# Patient Record
Sex: Female | Born: 1990 | Race: Black or African American | Hispanic: No | Marital: Single | Smoking: Current every day smoker
Health system: Southern US, Community
[De-identification: ages and names within clinical notes are randomized; demographics above are authoritative.]

## PROBLEM LIST (undated history)

## (undated) ENCOUNTER — Inpatient Hospital Stay (HOSPITAL_COMMUNITY): Payer: Self-pay

## (undated) DIAGNOSIS — K5090 Crohn's disease, unspecified, without complications: Secondary | ICD-10-CM

## (undated) DIAGNOSIS — O139 Gestational [pregnancy-induced] hypertension without significant proteinuria, unspecified trimester: Secondary | ICD-10-CM

## (undated) DIAGNOSIS — E119 Type 2 diabetes mellitus without complications: Secondary | ICD-10-CM

## (undated) DIAGNOSIS — J45909 Unspecified asthma, uncomplicated: Secondary | ICD-10-CM

## (undated) DIAGNOSIS — K219 Gastro-esophageal reflux disease without esophagitis: Secondary | ICD-10-CM

## (undated) HISTORY — PX: NO PAST SURGERIES: SHX2092

---

## 2015-01-05 ENCOUNTER — Emergency Department (HOSPITAL_COMMUNITY)
Admission: EM | Admit: 2015-01-05 | Discharge: 2015-01-05 | Disposition: A | Discharge status: Home / Self Care | Payer: Medicaid Other | Attending: Emergency Medicine | Admitting: Emergency Medicine

## 2015-01-05 ENCOUNTER — Encounter (HOSPITAL_COMMUNITY): Payer: Self-pay | Admitting: Emergency Medicine

## 2015-01-05 DIAGNOSIS — E11649 Type 2 diabetes mellitus with hypoglycemia without coma: Secondary | ICD-10-CM | POA: Diagnosis not present

## 2015-01-05 DIAGNOSIS — Z3202 Encounter for pregnancy test, result negative: Secondary | ICD-10-CM | POA: Insufficient documentation

## 2015-01-05 DIAGNOSIS — R55 Syncope and collapse: Secondary | ICD-10-CM | POA: Insufficient documentation

## 2015-01-05 DIAGNOSIS — A5901 Trichomonal vulvovaginitis: Secondary | ICD-10-CM | POA: Insufficient documentation

## 2015-01-05 DIAGNOSIS — Z8669 Personal history of other diseases of the nervous system and sense organs: Secondary | ICD-10-CM

## 2015-01-05 DIAGNOSIS — R42 Dizziness and giddiness: Secondary | ICD-10-CM | POA: Diagnosis present

## 2015-01-05 DIAGNOSIS — Z880 Allergy status to penicillin: Secondary | ICD-10-CM | POA: Insufficient documentation

## 2015-01-05 HISTORY — DX: Type 2 diabetes mellitus without complications: E11.9

## 2015-01-05 LAB — I-STAT TROPONIN, ED: Troponin i, poc: 0 ng/mL (ref 0.00–0.08)

## 2015-01-05 LAB — CBC
HCT: 43.2 % (ref 36.0–46.0)
Hemoglobin: 14 g/dL (ref 12.0–15.0)
MCH: 26.7 pg (ref 26.0–34.0)
MCHC: 32.4 g/dL (ref 30.0–36.0)
MCV: 82.4 fL (ref 78.0–100.0)
PLATELETS: 268 10*3/uL (ref 150–400)
RBC: 5.24 MIL/uL — ABNORMAL HIGH (ref 3.87–5.11)
RDW: 14.9 % (ref 11.5–15.5)
WBC: 10.2 10*3/uL (ref 4.0–10.5)

## 2015-01-05 LAB — BASIC METABOLIC PANEL
Anion gap: 8 (ref 5–15)
BUN: 10 mg/dL (ref 6–20)
CALCIUM: 9 mg/dL (ref 8.9–10.3)
CHLORIDE: 108 mmol/L (ref 101–111)
CO2: 24 mmol/L (ref 22–32)
CREATININE: 0.92 mg/dL (ref 0.44–1.00)
GFR calc Af Amer: >60 mL/min (ref >60)
GFR calc non Af Amer: >60 mL/min (ref >60)
Glucose, Bld: 78 mg/dL (ref 65–99)
Potassium: 3.7 mmol/L (ref 3.5–5.1)
Sodium: 140 mmol/L (ref 135–145)

## 2015-01-05 LAB — PREGNANCY, URINE: Preg Test, Ur: NEGATIVE

## 2015-01-05 LAB — URINE MICROSCOPIC-ADD ON

## 2015-01-05 LAB — D-DIMER, QUANTITATIVE: D-Dimer, Quant: 0.31 ug/mL-FEU (ref 0.00–0.48)

## 2015-01-05 LAB — URINALYSIS, ROUTINE W REFLEX MICROSCOPIC
BILIRUBIN URINE: NEGATIVE
GLUCOSE, UA: NEGATIVE mg/dL
KETONES UR: NEGATIVE mg/dL
Nitrite: NEGATIVE
PH: 6 (ref 5.0–8.0)
Protein, ur: NEGATIVE mg/dL
Specific Gravity, Urine: 1.023 (ref 1.005–1.030)
Urobilinogen, UA: 0.2 mg/dL (ref 0.0–1.0)

## 2015-01-05 LAB — CBG MONITORING, ED: Glucose-Capillary: 123 mg/dL — ABNORMAL HIGH (ref 65–99)

## 2015-01-05 MED ORDER — ACETAMINOPHEN 325 MG PO TABS
650.0000 mg | ORAL_TABLET | Freq: Once | ORAL | Status: AC
  Administered 2015-01-05: 650 mg via ORAL
  Filled 2015-01-05: qty 2

## 2015-01-05 MED ORDER — IBUPROFEN 800 MG PO TABS
800.0000 mg | ORAL_TABLET | Freq: Once | ORAL | Status: DC
  Filled 2015-01-05: qty 1

## 2015-01-05 MED ORDER — METRONIDAZOLE 500 MG PO TABS
2000.0000 mg | ORAL_TABLET | Freq: Once | ORAL | Status: AC
Start: 2015-01-05 — End: 2015-01-05
  Administered 2015-01-05: 2000 mg via ORAL
  Filled 2015-01-05: qty 4

## 2015-01-05 MED ORDER — ONDANSETRON 4 MG PO TBDP
4.0000 mg | ORAL_TABLET | Freq: Once | ORAL | Status: AC
  Administered 2015-01-05: 4 mg via ORAL
  Filled 2015-01-05: qty 1

## 2015-01-05 MED ORDER — SODIUM CHLORIDE 0.9 % IV BOLUS (SEPSIS)
1000.0000 mL | Freq: Once | INTRAVENOUS | Status: AC
  Administered 2015-01-05: 1000 mL via INTRAVENOUS

## 2015-01-05 MED ORDER — TETANUS-DIPHTH-ACELL PERTUSSIS 5-2.5-18.5 LF-MCG/0.5 IM SUSP
0.5000 mL | Freq: Once | INTRAMUSCULAR | Status: AC
  Administered 2015-01-05: 0.5 mL via INTRAMUSCULAR
  Filled 2015-01-05: qty 0.5

## 2015-01-05 NOTE — ED Notes (Addendum)
EMS called out because she felt weak and couldn't see. When EMS got there she was eating food, CBG was 100. EMS states one minute acts like she can see, then the next states she's having blurry vision. States this happens frequently when her blood sugar gets low. Also got stung by a bee to the back of right arm, EMS states only localized reaction.  Pt states her vision is normally blurry, but gets worse when her glucose gets low. Pt states she can only see colors, pt texting when I walked into room holding the phone close to her face. States she's feeling better since eating, but that her vision is still very blurry. Also complaining of bee sting to right lower abdomin, small amount of localized swelling to area.

## 2015-01-05 NOTE — ED Notes (Signed)
Pt refused to allow PA Joni Reining to do pelvic exam, she agreed to have her family doctor do it.

## 2015-01-05 NOTE — Discharge Instructions (Signed)
Please follow with your primary care doctor in the next 2 days for a check-up. They must obtain records for further management.   Do not hesitate to return to the Emergency Department for any new, worsening or concerning symptoms.   You were not tested for all STDs today. Refrain from sex until you have the results from a full STD screen. Please go to [facility] (Address: [address], [place], [place] [address] Phone: [phone]) or see the [facility] (Address: [address] Phone: [phone]) for full STD screening. Return to the emergency room for worsening of symptoms, fever, and vomiting.    Trichomoniasis Trichomoniasis is an infection caused by an organism called Trichomonas. The infection can affect both women and men. In women, the outer female genitalia and the vagina are affected. In men, the penis is mainly affected, but the prostate and other reproductive organs can also be involved. Trichomoniasis is a sexually transmitted infection (STI) and is most often passed to another person through sexual contact.  RISK FACTORS  Having unprotected sexual intercourse.  Having sexual intercourse with an infected partner. SIGNS AND SYMPTOMS  Symptoms of trichomoniasis in women include:  Abnormal gray-green frothy vaginal discharge.  Itching and irritation of the vagina.  Itching and irritation of the area outside the vagina. Symptoms of trichomoniasis in men include:   Penile discharge with or without pain.  Pain during urination. This results from inflammation of the urethra. DIAGNOSIS  Trichomoniasis may be found during a Pap test or physical exam. Your health care provider may use one of the following methods to help diagnose this infection:  Testing the pH of the vagina with a test tape.  Using a vaginal swab test that checks for the Trichomonas organism. A test is available that provides results within a few minutes.  Examining a urine  sample.  Testing vaginal secretions. Your health care provider may test you for other STIs, including HIV. TREATMENT   You may be given medicine to fight the infection. Women should inform their health care provider if they could be or are pregnant. Some medicines used to treat the infection should not be taken during pregnancy.  Your health care provider may recommend over-the-counter medicines or creams to decrease itching or irritation.  Your sexual partner will need to be treated if infected.  Your health care provider may test you for infection again 3 months after treatment. HOME CARE INSTRUCTIONS   Take medicines only as directed by your health care provider.  Take over-the-counter medicine for itching or irritation as directed by your health care provider.  Do not have sexual intercourse while you have the infection.  Women should not douche or wear tampons while they have the infection.  Discuss your infection with your partner. Your partner may have gotten the infection from you, or you may have gotten it from your partner.  Have your sex partner get examined and treated if necessary.  Practice safe, informed, and protected sex.  See your health care provider for other STI testing. SEEK MEDICAL CARE IF:   You still have symptoms after you finish your medicine.  You develop abdominal pain.  You have pain when you urinate.  You have bleeding after sexual intercourse.  You develop a rash.  Your medicine makes you sick or makes you throw up (vomit). MAKE SURE YOU:  Understand these instructions.  Will watch your condition.  Will get help right away if you are not doing well or get  worse.   This information is not intended to replace advice given to you by your health care provider. Make sure you discuss any questions you have with your health care provider.   Document Released: 09/06/2000 Document Revised: 04/03/2014 Document Reviewed: 12/23/2012 Elsevier  Interactive Patient Education [employer]

## 2015-01-05 NOTE — ED Provider Notes (Signed)
CSN: [id number]     Arrival date & time 01/05/15  1422 History   First MD Initiated Contact with Patient 01/05/15 1547     Chief Complaint  Patient presents with  . Blurred Vision  . Hypoglycemia     (Consider location/radiation/quality/duration/timing/severity/associated sxs/prior Treatment) HPI  Blood pressure 141/81, pulse 101, temperature 98.3 F (36.8 C), temperature source Oral, resp. rate 18, SpO2 100 %.  Olivia Jarvis is a 24 y.o. female complaining of chronic blurred vision which she states she's had for years, states she had glasses when she was 46 but never got them replaced and they were lost. Patient also reports feeling lightheaded consistent with prior episodes of hypoglycemia. States that she has a anterior chest pain that is not pleuritic or exacerbated with exertion, movement or palpation. States that this started yesterday. Patient is diabetic but not taking medications, was advised by her primary care to DC metformin because it made her feel lightheaded. She supposed to be checking her blood sugars at home but has not done that in the week because she is out of test strips. Patient denies nausea, vomiting, history of DVT or PE, she takes Depakote shots for birth control. No recent immobilizations. No family history of early cardiac death. States that her vision is unchanged from her baseline. States that by the time EMS got to her for lightheaded sensation she had been eating and her blood sugar was normalized. Patient states while she was waiting for EMS she was stung by a bee, last tetanus shot unknown. Denies shortness of breath, cough, dyspepsia, nausea, vomiting, HLD, FH of ACS. Pt endorses occasional tobacco use.   Past Medical History  Diagnosis Date  . Diabetes mellitus without complication (HCC)    History reviewed. No pertinent past surgical history. History reviewed. No pertinent family history. Social History  Substance Use Topics  . Smoking status: None   . Smokeless tobacco: None  . Alcohol Use: None   OB History    No data available     Review of Systems  10 systems reviewed and found to be negative, except as noted in the HPI.   Allergies  Penicillins  Home Medications   Prior to Admission medications   Medication Sig Start Date End Date Taking? Authorizing Provider  doxycycline (VIBRAMYCIN) 100 MG capsule take 1 capsule by mouth twice a day for 14 days 12/29/14   Historical Provider, MD   BP 115/71 mmHg  Pulse 86  Temp(Src) 98.3 F (36.8 C) (Oral)  Resp 18  SpO2 100% Physical Exam  Constitutional: She is oriented to person, place, and time. She appears well-developed and well-nourished. No distress.  HENT:  Head: Normocephalic.  Mouth/Throat: Oropharynx is clear and moist.  Eyes: Conjunctivae and EOM are normal. Pupils are equal, round, and reactive to light.  Neck: Normal range of motion. No JVD present. No tracheal deviation present.  Cardiovascular: Normal rate, regular rhythm and intact distal pulses.   Radial pulse equal bilaterally  Pulmonary/Chest: Effort normal and breath sounds normal. No stridor. No respiratory distress. She has no wheezes. She has no rales. She exhibits no tenderness.  Abdominal: Soft. She exhibits no distension and no mass. There is no tenderness. There is no rebound and no guarding.  Musculoskeletal: Normal range of motion. She exhibits no edema or tenderness.  No calf asymmetry, superficial collaterals, palpable cords, edema, Homans sign negative bilaterally.    Neurological: She is alert and oriented to person, place, and time.  Skin: Skin is warm.  She is not diaphoretic.  Psychiatric: She has a normal mood and affect.  Nursing note and vitals reviewed.   ED Course  Procedures (including critical care time) Labs Review Labs Reviewed  CBC - Abnormal; Notable for the following:    RBC 5.24 (*)    All other components within normal limits  URINALYSIS, ROUTINE W REFLEX MICROSCOPIC  (NOT AT [facility]) - Abnormal; Notable for the following:    APPearance CLOUDY (*)    Hgb urine dipstick TRACE (*)    Leukocytes, UA MODERATE (*)    All other components within normal limits  URINE MICROSCOPIC-ADD ON - Abnormal; Notable for the following:    Squamous Epithelial / LPF MANY (*)    All other components within normal limits  CBG MONITORING, ED - Abnormal; Notable for the following:    Glucose-Capillary 123 (*)    All other components within normal limits  WET PREP, GENITAL  URINE CULTURE  BASIC METABOLIC PANEL  PREGNANCY, URINE  D-DIMER, QUANTITATIVE (NOT AT [facility])  RPR  HIV ANTIBODY (ROUTINE TESTING)  CBG MONITORING, ED  I-STAT TROPOININ, ED  GC/CHLAMYDIA PROBE AMP ([place]) NOT AT [facility]    Imaging Review No results found. I have personally reviewed and evaluated these images and lab results as part of my medical decision-making.   EKG Interpretation None      MDM   Final diagnoses:  Trichomonal vaginitis  Pre-syncope  History of blurred vision    Filed Vitals:   01/05/15 1430 01/05/15 1728  BP: 141/81 115/71  Pulse: 101 86  Temp: 98.3 F (36.8 C)   TempSrc: Oral   Resp: 18 18  SpO2: 100% 100%    Medications  ibuprofen (ADVIL,MOTRIN) tablet 800 mg (800 mg Oral Not Given 01/05/15 1745)  metroNIDAZOLE (FLAGYL) tablet 2,000 mg (not administered)  ondansetron (ZOFRAN-ODT) disintegrating tablet 4 mg (not administered)  sodium chloride 0.9 % bolus 1,000 mL (0 mLs Intravenous Stopped 01/05/15 1833)  Tdap (BOOSTRIX) injection 0.5 mL (0.5 mLs Intramuscular Given 01/05/15 1639)  acetaminophen (TYLENOL) tablet 650 mg (650 mg Oral Given 01/05/15 1757)    Olivia Jarvis is a pleasant 24 y.o. female presenting with lightheaded sensation, has blurred vision which appears to be at her baseline. Patient has diabetes but is not on medications. States that she felt like her sugar was low enough which she attributed to her lightheadedness. Also complaining of  anterior chest pain. Patient takes Depo shot for birth control. Initially tachycardic, however PE not c/w DVT.   Dimer negative, Low risk by HEART score with normal EKG and normal troponin. Not orthostatic.   Urine is highly contaminated however however there is trichomonas present. Attempted to perform pelvic exam however patient could not cooperate with the exam, she states that she had a pelvic exam only 2 weeks ago. [facility] [place]. Patient will be treated for trichomonas with 2 g Flagyl. Advised her to refrain from sex or will set until she is a full STD screen.  Evaluation does not show pathology that would require ongoing emergent intervention or inpatient treatment. Pt is hemodynamically stable and mentating appropriately. Discussed findings and plan with patient/guardian, who agrees with care plan. All questions answered. Return precautions discussed and outpatient follow up given.      Wynetta Emery, PA-C 01/05/15 1909  Lavera Guise, MD 01/06/15 [phone]

## 2015-01-06 LAB — HIV ANTIBODY (ROUTINE TESTING W REFLEX): HIV SCREEN 4TH GENERATION: NONREACTIVE

## 2015-01-06 LAB — RPR: RPR: NONREACTIVE

## 2015-01-07 LAB — URINE CULTURE

## 2016-01-31 ENCOUNTER — Encounter (HOSPITAL_COMMUNITY): Payer: Self-pay | Admitting: *Deleted

## 2016-01-31 ENCOUNTER — Inpatient Hospital Stay (HOSPITAL_COMMUNITY)
Admission: AD | Admit: 2016-01-31 | Discharge: 2016-01-31 | Disposition: A | Discharge status: Home / Self Care | Payer: Medicaid Other | Source: Ambulatory Visit | Attending: Family Medicine | Admitting: Family Medicine

## 2016-01-31 DIAGNOSIS — O219 Vomiting of pregnancy, unspecified: Secondary | ICD-10-CM

## 2016-01-31 DIAGNOSIS — O99331 Smoking (tobacco) complicating pregnancy, first trimester: Secondary | ICD-10-CM | POA: Diagnosis not present

## 2016-01-31 DIAGNOSIS — R55 Syncope and collapse: Secondary | ICD-10-CM | POA: Diagnosis not present

## 2016-01-31 DIAGNOSIS — Z880 Allergy status to penicillin: Secondary | ICD-10-CM | POA: Insufficient documentation

## 2016-01-31 DIAGNOSIS — Z3A01 Less than 8 weeks gestation of pregnancy: Secondary | ICD-10-CM | POA: Diagnosis not present

## 2016-01-31 DIAGNOSIS — Y92481 Parking lot as the place of occurrence of the external cause: Secondary | ICD-10-CM | POA: Diagnosis not present

## 2016-01-31 DIAGNOSIS — O210 Mild hyperemesis gravidarum: Secondary | ICD-10-CM | POA: Insufficient documentation

## 2016-01-31 HISTORY — DX: Unspecified asthma, uncomplicated: J45.909

## 2016-01-31 HISTORY — DX: Gastro-esophageal reflux disease without esophagitis: K21.9

## 2016-01-31 HISTORY — DX: Gestational (pregnancy-induced) hypertension without significant proteinuria, unspecified trimester: O13.9

## 2016-01-31 LAB — CBC WITH DIFFERENTIAL/PLATELET
Basophils Absolute: 0 10*3/uL (ref 0.0–0.1)
Basophils Relative: 0 %
Eosinophils Absolute: 0 10*3/uL (ref 0.0–0.7)
Eosinophils Relative: 0 %
HEMATOCRIT: 38.8 % (ref 36.0–46.0)
HEMOGLOBIN: 13.2 g/dL (ref 12.0–15.0)
LYMPHS ABS: 2.8 10*3/uL (ref 0.7–4.0)
LYMPHS PCT: 19 %
MCH: 26.5 pg (ref 26.0–34.0)
MCHC: 34 g/dL (ref 30.0–36.0)
MCV: 77.9 fL — AB (ref 78.0–100.0)
MONOS PCT: 4 %
Monocytes Absolute: 0.6 10*3/uL (ref 0.1–1.0)
NEUTROS ABS: 10.9 10*3/uL — AB (ref 1.7–7.7)
NEUTROS PCT: 77 %
Platelets: 262 10*3/uL (ref 150–400)
RBC: 4.98 MIL/uL (ref 3.87–5.11)
RDW: 14.8 % (ref 11.5–15.5)
WBC: 14.4 10*3/uL — AB (ref 4.0–10.5)

## 2016-01-31 LAB — URINALYSIS, ROUTINE W REFLEX MICROSCOPIC
GLUCOSE, UA: NEGATIVE mg/dL
HGB URINE DIPSTICK: NEGATIVE
Ketones, ur: >80 mg/dL — AB
LEUKOCYTES UA: NEGATIVE
Nitrite: NEGATIVE
PH: 6.5 (ref 5.0–8.0)
Protein, ur: 30 mg/dL — AB

## 2016-01-31 LAB — POCT PREGNANCY, URINE: PREG TEST UR: POSITIVE — AB

## 2016-01-31 LAB — URINE MICROSCOPIC-ADD ON: RBC / HPF: NONE SEEN RBC/hpf (ref 0–5)

## 2016-01-31 MED ORDER — PROMETHAZINE HCL 25 MG PO TABS: 12.5000 mg | ORAL_TABLET | Freq: Four times a day (QID) | ORAL | 0 refills | Status: AC | PRN

## 2016-01-31 MED ORDER — DEXTROSE IN LACTATED RINGERS 5 % IV SOLN
12.5000 mg | Freq: Once | INTRAVENOUS | Status: AC
  Administered 2016-01-31: 12.5 mg via INTRAVENOUS
  Filled 2016-01-31: qty 0.5

## 2016-01-31 NOTE — MAU Provider Note (Signed)
History     CSN: [id number]  Arrival date and time: 01/31/16 1656   None     Chief Complaint  Patient presents with  . Vaginal Pain  . Emesis   HPI Olivia Jarvis is 25 y.o. [phone] 1265w6d pregnant presenting with nausea and vomiting. She is unable to keep fluids and food down.  She states she is pregnant with twins told there were 2 sacs.  Also complains of vaginal pain that began today at 5 am.  Denies vaginal bleeding.  Has not taken medication for nausea, unable to afford Rx's. States she has hx of syncope, evaluated without findings.  States she passed out in our parking lot today.  [place]   Past Medical History:  Diagnosis Date  . Asthma   . Diabetes mellitus without complication (HCC)   . GERD (gastroesophageal reflux disease)   . Pregnancy induced hypertension     Past Surgical History:  Procedure Laterality Date  . NO PAST SURGERIES      History reviewed. No pertinent family history.  Social History  Substance Use Topics  . Smoking status: Current Every Day Smoker  . Smokeless tobacco: Never Used  . Alcohol use No    Allergies:  Allergies  Allergen Reactions  . Penicillins Hives and Rash    Prescriptions Prior to Admission  Medication Sig Dispense Refill Last Dose  . doxycycline (VIBRAMYCIN) 100 MG capsule take 1 capsule by mouth twice a day for 14 days  0     Review of Systems  Constitutional: Negative for chills and fever.       Syncope--has been observed by family.  Hx of syncope before pregnancy.  Was seen by MD, told no reasons found.    Gastrointestinal: Positive for nausea and vomiting.  Genitourinary: Negative for dysuria, flank pain, frequency, hematuria and urgency.       Neg for vaginal bleeding.   + for vaginal pain.  Neurological: Positive for weakness.       Syncope   Physical Exam   Blood pressure 127/75, pulse 95, temperature 99 F (37.2 C), temperature source Oral, resp. rate 18, weight 201 lb 3.2 oz  (91.3 kg), last menstrual period 12/14/2015.  Physical Exam  Nursing note and vitals reviewed. Constitutional: She is oriented to person, place, and time. She appears well-developed and well-nourished. No distress.  HENT:  Head: Normocephalic.  Cardiovascular: Normal rate.   Respiratory: Effort normal.  GI: Soft. There is no tenderness.  Genitourinary:  Genitourinary Comments: Deferred, neg for vaginal sxs.  Musculoskeletal: Normal range of motion.  Neurological: She is alert and oriented to person, place, and time.  Skin: Skin is warm and dry.  Psychiatric: She has a normal mood and affect. Her behavior is normal. Thought content normal.   Results for orders placed or performed during the hospital encounter of 01/31/16 (from the past 24 hour(s))  Urinalysis, Routine w reflex microscopic (not at [facility])     Status: Abnormal   Collection Time: 01/31/16  5:01 PM  Result Value Ref Range   Color, Urine AMBER (A) YELLOW   APPearance CLEAR CLEAR   Specific Gravity, Urine >1.030 (H) 1.005 - 1.030   pH 6.5 5.0 - 8.0   Glucose, UA NEGATIVE NEGATIVE mg/dL   Hgb urine dipstick NEGATIVE NEGATIVE   Bilirubin Urine SMALL (A) NEGATIVE   Ketones, ur >80 (A) NEGATIVE mg/dL   Protein, ur 30 (A) NEGATIVE mg/dL   Nitrite NEGATIVE NEGATIVE   Leukocytes, UA  NEGATIVE NEGATIVE  Urine microscopic-add on     Status: Abnormal   Collection Time: 01/31/16  5:01 PM  Result Value Ref Range   Squamous Epithelial / LPF 6-30 (A) NONE SEEN   WBC, UA 6-30 0 - 5 WBC/hpf   RBC / HPF NONE SEEN 0 - 5 RBC/hpf   Bacteria, UA MANY (A) NONE SEEN   Urine-Other MUCOUS PRESENT   Pregnancy, urine POC     Status: Abnormal   Collection Time: 01/31/16  5:10 PM  Result Value Ref Range   Preg Test, Ur POSITIVE (A) NEGATIVE  CBC with Differential/Platelet     Status: Abnormal   Collection Time: 01/31/16  9:11 PM  Result Value Ref Range   WBC 14.4 (H) 4.0 - 10.5 K/uL   RBC 4.98 3.87 - 5.11 MIL/uL   Hemoglobin 13.2 12.0 -  15.0 g/dL   HCT 81.138.8 91.436.0 - 78.246.0 %   MCV 77.9 (L) 78.0 - 100.0 fL   MCH 26.5 26.0 - 34.0 pg   MCHC 34.0 30.0 - 36.0 g/dL   RDW 95.614.8 21.311.5 - 08.615.5 %   Platelets 262 150 - 400 K/uL   Neutrophils Relative % 77 %   Neutro Abs 10.9 (H) 1.7 - 7.7 K/uL   Lymphocytes Relative 19 %   Lymphs Abs 2.8 0.7 - 4.0 K/uL   Monocytes Relative 4 %   Monocytes Absolute 0.6 0.1 - 1.0 K/uL   Eosinophils Relative 0 %   Eosinophils Absolute 0.0 0.0 - 0.7 K/uL   Basophils Relative 0 %   Basophils Absolute 0.0 0.0 - 0.1 K/uL     US results from care everywhere:  Two gestational sacs identified in the uterus, consistent with a twin intrauterine gestation. Gestational age estimated at about 6 weeks by mean sac diameter.  Normal ovaries.  Result Narrative  ULTRASOUND OF PREGNANT UTERUS, TRANSABDOMINAL, TRANSVAGINAL WITH DOPPLER, 01/15/2016 4:17 AM  INDICATION: Pregnant. ADDITIONAL HISTORY: Beta-hCG 61,307 COMPARISON: None.  TRANSABDOMINAL EXAM:  GRAYSCALE TECHNIQUE: Multi-planar real-time ultrasound of the pelvis utilizing a transabdominal approach was performed. The scan provides a larger field of view to help identify abnormalities that may be overlooked by the smaller field of view of the transvaginal exam.  FINDINGS:  .Uterus: Normal contour and echotexture. Intrauterine pregnancy is confirmed.  .Right ovary/adnexa: Normal. .Left ovary/adnexa: Normal. .Bladder: Normal. .Peritoneum: Trace free fluid in the pelvis.  TRANSVAGINAL EXAM:  GRAYSCALE TECHNIQUE: Multiplanar real-time ultrasound of the pelvis utilizing a transvaginal approach was performed. The exam provides detailed information about the endometrial canal, ovaries and their blood supply.  FINDINGS:  .Uterus: Normal contour and echotexture. No parenchymal lesion is identified. .The size of the uterus is: length 7.3 cm sagittal by 4.7 cm AP by 4.9 cm transverse. .There appear to be two gestational sacs present in the  uterus, consistent wit a twin pregnancy. . Twin 1 is located on the right aspect of the uterus and has a mean gestational sac diameter of 1.3 cm indicating a gestational age of [redacted] weeks and 0 days. A yolk sac is present but no fetal pole is identified. .Twin 2 is located on the left aspect of the uterus and has a mean gestational sac diameter of 1.5 cm indicating a gestational age of [redacted] weeks and 1 day. A yolk sac is present but no fetal pole is  .Right ovary/adnexa: Normal size, contour, and echotexture. No mass. The ovary measures 2.8 x 2 x 2 cm. Volume = 5.8 ml. .Left ovary/adnexa: Normal size, contour,  and echotexture. No mass. The ovary measures 3.5 x 2.4 x 1.4 cm. Volume = 6.2 ml. .Peritoneum: Trace amount of fluid in the cul-de-sac.  DOPPLER TECHNIQUE: Color and/or power Doppler combined with spectral Doppler analysis to evaluate blood flow to and from the ovaries was performed.  FINDINGS:  .Right ovary: Arterial and venous blood flow are documented with Doppler. .Left ovary: Arterial and venous blood flow are documented with Doppler.  ADDITIONAL COMMENTS: None.    MAU Course  Procedures   Urine to lab for C&S  MDM MSE Exam Labs EKG IV hydration with D5LR with Phenergan 12.5mg  IV--patient needs to take bus home and will use lower end of dosing for Phenergan Care turned over to Zorita Pang, CNM at 21:00.  Patient has had phenergan and IV fluids. She is feeling better, and has tolerated PO  Assessment and Plan   1. Nausea and vomiting during pregnancy prior to [redacted] weeks gestation   2. [redacted] weeks gestation of pregnancy    DC home Comfort measures reviewed  1st trimester precautions UC pending  RX: phenergan PRN #[id number]  Return to MAU as needed FU with OB as planned  Follow-up Information    [facility] Follow up.   Contact information: [address] [place] [address] [phone]            Matt Holmes 01/31/2016, 8:21 PM

## 2016-01-31 NOTE — MAU Note (Addendum)
Can't stop throwing up, can't eat no food.  Has been throwing up since she was preg with her 26 yr old..  Is preg with twins.  Is having serious pain.in vaginal area since 0500.  No bleeding. Every time she throws up, she "boo boos"

## 2016-02-02 LAB — CULTURE, OB URINE

## 2016-05-20 ENCOUNTER — Inpatient Hospital Stay (HOSPITAL_COMMUNITY)
Admission: AD | Admit: 2016-05-20 | Discharge: 2016-05-20 | Disposition: A | Discharge status: Home / Self Care | Payer: Medicaid Other | Source: Ambulatory Visit | Attending: Obstetrics and Gynecology | Admitting: Obstetrics and Gynecology

## 2016-05-20 ENCOUNTER — Inpatient Hospital Stay (HOSPITAL_COMMUNITY): Payer: Medicaid Other

## 2016-05-20 ENCOUNTER — Encounter (HOSPITAL_COMMUNITY): Payer: Self-pay | Admitting: Certified Nurse Midwife

## 2016-05-20 DIAGNOSIS — O26892 Other specified pregnancy related conditions, second trimester: Secondary | ICD-10-CM | POA: Insufficient documentation

## 2016-05-20 DIAGNOSIS — O4692 Antepartum hemorrhage, unspecified, second trimester: Secondary | ICD-10-CM | POA: Insufficient documentation

## 2016-05-20 DIAGNOSIS — O99332 Smoking (tobacco) complicating pregnancy, second trimester: Secondary | ICD-10-CM | POA: Diagnosis not present

## 2016-05-20 DIAGNOSIS — O99612 Diseases of the digestive system complicating pregnancy, second trimester: Secondary | ICD-10-CM | POA: Insufficient documentation

## 2016-05-20 DIAGNOSIS — Z880 Allergy status to penicillin: Secondary | ICD-10-CM | POA: Insufficient documentation

## 2016-05-20 DIAGNOSIS — R109 Unspecified abdominal pain: Secondary | ICD-10-CM | POA: Diagnosis not present

## 2016-05-20 DIAGNOSIS — J45909 Unspecified asthma, uncomplicated: Secondary | ICD-10-CM | POA: Diagnosis not present

## 2016-05-20 DIAGNOSIS — O24912 Unspecified diabetes mellitus in pregnancy, second trimester: Secondary | ICD-10-CM | POA: Diagnosis not present

## 2016-05-20 DIAGNOSIS — K219 Gastro-esophageal reflux disease without esophagitis: Secondary | ICD-10-CM | POA: Diagnosis not present

## 2016-05-20 DIAGNOSIS — Z79899 Other long term (current) drug therapy: Secondary | ICD-10-CM | POA: Insufficient documentation

## 2016-05-20 DIAGNOSIS — N939 Abnormal uterine and vaginal bleeding, unspecified: Secondary | ICD-10-CM

## 2016-05-20 DIAGNOSIS — O30002 Twin pregnancy, unspecified number of placenta and unspecified number of amniotic sacs, second trimester: Secondary | ICD-10-CM | POA: Insufficient documentation

## 2016-05-20 DIAGNOSIS — Z3A22 22 weeks gestation of pregnancy: Secondary | ICD-10-CM | POA: Diagnosis not present

## 2016-05-20 DIAGNOSIS — O99512 Diseases of the respiratory system complicating pregnancy, second trimester: Secondary | ICD-10-CM | POA: Diagnosis not present

## 2016-05-20 LAB — WET PREP, GENITAL
CLUE CELLS WET PREP: NONE SEEN
Sperm: NONE SEEN
TRICH WET PREP: NONE SEEN
Yeast Wet Prep HPF POC: NONE SEEN

## 2016-05-20 MED ORDER — ONDANSETRON 4 MG PO TBDP
4.0000 mg | ORAL_TABLET | Freq: Once | ORAL | Status: AC
  Administered 2016-05-20: 4 mg via ORAL
  Filled 2016-05-20: qty 1

## 2016-05-20 MED ORDER — ACETAMINOPHEN 650 MG RE SUPP
650.0000 mg | Freq: Once | RECTAL | Status: AC
  Administered 2016-05-20: 650 mg via RECTAL
  Filled 2016-05-20: qty 1

## 2016-05-20 NOTE — MAU Provider Note (Signed)
  History     CSN: [id number]  Arrival date and time: 05/20/16 0243   None     Chief Complaint  Patient presents with  . Vaginal Bleeding  . Abdominal Pain   HPI   Olivia Jarvis is 26 y.o. G2P0101 at 4159w4d who presented with report of vaginal bleeding. Reports it soaked through to her bedsheets. Also with mild abd cramping. No leaking of fluid. Denies ctx. Her preg has [facility] [place] and has been remarkable for 1) twins 2) N/V/ptyalism 3) DM  [facility] the same complaint on 2/21.   OB History    Gravida Para Term Preterm AB Living   2 1   1   1    SAB TAB Ectopic Multiple Live Births                  Past Medical History:  Diagnosis Date  . Asthma   . Diabetes mellitus without complication (HCC)   . GERD (gastroesophageal reflux disease)   . Pregnancy induced hypertension     Past Surgical History:  Procedure Laterality Date  . NO PAST SURGERIES      History reviewed. No pertinent family history.  Social History  Substance Use Topics  . Smoking status: Current Every Day Smoker  . Smokeless tobacco: Never Used  . Alcohol use No    Allergies:  Allergies  Allergen Reactions  . Penicillins Hives and Rash    Prescriptions Prior to Admission  Medication Sig Dispense Refill Last Dose  . promethazine (PHENERGAN) 25 MG tablet Take 0.5-1 tablets (12.5-25 mg total) by mouth every 6 (six) hours as needed. 30 tablet 0     Review of Systems No other pertinents other than what is listed in HPI Physical Exam   Blood pressure 126/78, pulse 85, temperature 98.6 F (37 C), temperature source Oral, resp. rate 16, last menstrual period 12/14/2015.  Physical Exam  Constitutional: She is oriented to person, place, and time. She appears well-developed.  HENT:  Head: Normocephalic.  Neck: Normal range of motion.  Cardiovascular: Normal rate.   Respiratory: Effort normal.  Genitourinary:   Genitourinary Comments: SE: sm white vag d/c; no evidence of recent bleeding; cx C/L  Musculoskeletal: Normal range of motion.  Neurological: She is alert and oriented to person, place, and time.  Skin: Skin is warm and dry.  Psychiatric: She has a normal mood and affect. Her behavior is normal. Thought content normal.   U/S: twin IUP, no evidence abruption, cx 2.7cm  Wet prep: many WBC, few bacteria  MAU Course  Procedures  MDM Wet prep, GC/chlam, U/S ordered Tylenol and Zofran ODT given   Assessment and Plan   From review [facility], patient is O+. US results from this pregnancy not available.   Twin IUP@22 .4wks Report of vag bleeding  D/C home in stable condition PTL/bleeding precautions rev'd  F/U at next OB visit  Cam HaiSHAW, KIMBERLY CNM 05/20/2016 4:36 AM

## 2016-05-20 NOTE — Progress Notes (Signed)
Dr Neldon McKathrine Wallace notified of pt's admission via ems with twins at 22.4wks. Aware of hx bleeding with some bright blood on panties. Abd soft but tender to touch. FHTs dopplered x 2. Will see pt

## 2016-05-20 NOTE — Discharge Instructions (Signed)
Second Trimester of Pregnancy The second trimester is from week 13 through week 28, month 4 through 6. This is often the time in pregnancy that you feel your best. Often times, morning sickness has lessened or quit. You may have more energy, and you may get hungry more often. Your unborn baby (fetus) is growing rapidly. At the end of the sixth month, he or she is about 9 inches long and weighs about 1 pounds. You will likely feel the baby move (quickening) between 18 and 20 weeks of pregnancy. Follow these instructions at home:  Avoid all smoking, herbs, and alcohol. Avoid drugs not approved by your doctor.  Do not use any tobacco products, including cigarettes, chewing tobacco, and electronic cigarettes. If you need help quitting, ask your doctor. You may get counseling or other support to help you quit.  Only take medicine as told by your doctor. Some medicines are safe and some are not during pregnancy.  Exercise only as told by your doctor. Stop exercising if you start having cramps.  Eat regular, healthy meals.  Wear a good support bra if your breasts are tender.  Do not use hot tubs, steam rooms, or saunas.  Wear your seat belt when driving.  Avoid raw meat, uncooked cheese, and liter boxes and soil used by cats.  Take your prenatal vitamins.  Take 1500-2000 milligrams of calcium daily starting at the 20th week of pregnancy until you deliver your baby.  Try taking medicine that helps you poop (stool softener) as needed, and if your doctor approves. Eat more fiber by eating fresh fruit, vegetables, and whole grains. Drink enough fluids to keep your pee (urine) clear or pale yellow.  Take warm water baths (sitz baths) to soothe pain or discomfort caused by hemorrhoids. Use hemorrhoid cream if your doctor approves.  If you have puffy, bulging veins (varicose veins), wear support hose. Raise (elevate) your feet for 15 minutes, 3-4 times a day. Limit salt in your diet.  Avoid heavy  lifting, wear low heals, and sit up straight.  Rest with your legs raised if you have leg cramps or low back pain.  Visit your dentist if you have not gone during your pregnancy. Use a soft toothbrush to brush your teeth. Be gentle when you floss.  You can have sex (intercourse) unless your doctor tells you not to.  Go to your doctor visits. Get help if:  You feel dizzy.  You have mild cramps or pressure in your lower belly (abdomen).  You have a nagging pain in your belly area.  You continue to feel sick to your stomach (nauseous), throw up (vomit), or have watery poop (diarrhea).  You have bad smelling fluid coming from your vagina.  You have pain with peeing (urination). Get help right away if:  You have a fever.  You are leaking fluid from your vagina.  You have spotting or bleeding from your vagina.  You have severe belly cramping or pain.  You lose or gain weight rapidly.  You have trouble catching your breath and have chest pain.  You notice sudden or extreme puffiness (swelling) of your face, hands, ankles, feet, or legs.  You have not felt the baby move in over an hour.  You have severe headaches that do not go away with medicine.  You have vision changes. This information is not intended to replace advice given to you by your health care provider. Make sure you discuss any questions you have with your health care   provider. Document Released: 06/07/2009 Document Revised: 08/19/2015 Document Reviewed: 05/14/2012 Elsevier Interactive Patient Education  2017 [employer]  

## 2016-05-22 LAB — GC/CHLAMYDIA PROBE AMP (~~LOC~~) NOT AT ARMC
CHLAMYDIA, DNA PROBE: NEGATIVE
NEISSERIA GONORRHEA: NEGATIVE

## 2016-12-05 ENCOUNTER — Encounter (HOSPITAL_COMMUNITY): Payer: Self-pay

## 2017-02-23 ENCOUNTER — Emergency Department (HOSPITAL_BASED_OUTPATIENT_CLINIC_OR_DEPARTMENT_OTHER)
Admission: EM | Admit: 2017-02-23 | Discharge: 2017-02-23 | Disposition: A | Discharge status: Home / Self Care | Payer: Medicaid Other | Attending: Emergency Medicine | Admitting: Emergency Medicine

## 2017-02-23 ENCOUNTER — Encounter (HOSPITAL_BASED_OUTPATIENT_CLINIC_OR_DEPARTMENT_OTHER): Payer: Self-pay | Admitting: *Deleted

## 2017-02-23 ENCOUNTER — Other Ambulatory Visit: Payer: Self-pay

## 2017-02-23 DIAGNOSIS — R112 Nausea with vomiting, unspecified: Secondary | ICD-10-CM

## 2017-02-23 DIAGNOSIS — E119 Type 2 diabetes mellitus without complications: Secondary | ICD-10-CM | POA: Insufficient documentation

## 2017-02-23 DIAGNOSIS — K2900 Acute gastritis without bleeding: Secondary | ICD-10-CM | POA: Diagnosis not present

## 2017-02-23 DIAGNOSIS — R1111 Vomiting without nausea: Secondary | ICD-10-CM | POA: Diagnosis present

## 2017-02-23 DIAGNOSIS — J45909 Unspecified asthma, uncomplicated: Secondary | ICD-10-CM | POA: Insufficient documentation

## 2017-02-23 DIAGNOSIS — F17200 Nicotine dependence, unspecified, uncomplicated: Secondary | ICD-10-CM | POA: Diagnosis not present

## 2017-02-23 DIAGNOSIS — Z79899 Other long term (current) drug therapy: Secondary | ICD-10-CM | POA: Insufficient documentation

## 2017-02-23 HISTORY — DX: Crohn's disease, unspecified, without complications: K50.90

## 2017-02-23 LAB — COMPREHENSIVE METABOLIC PANEL
ALBUMIN: 4 g/dL (ref 3.5–5.0)
ALT: 22 U/L (ref 14–54)
AST: 24 U/L (ref 15–41)
Alkaline Phosphatase: 63 U/L (ref 38–126)
Anion gap: 12 (ref 5–15)
BUN: 6 mg/dL (ref 6–20)
CHLORIDE: 100 mmol/L — AB (ref 101–111)
CO2: 22 mmol/L (ref 22–32)
Calcium: 9 mg/dL (ref 8.9–10.3)
Creatinine, Ser: 0.92 mg/dL (ref 0.44–1.00)
GFR calc Af Amer: >60 mL/min (ref >60)
GFR calc non Af Amer: >60 mL/min (ref >60)
GLUCOSE: 126 mg/dL — AB (ref 65–99)
POTASSIUM: 3.3 mmol/L — AB (ref 3.5–5.1)
Sodium: 134 mmol/L — ABNORMAL LOW (ref 135–145)
Total Bilirubin: 0.6 mg/dL (ref 0.3–1.2)
Total Protein: 7.7 g/dL (ref 6.5–8.1)

## 2017-02-23 LAB — CBC
HCT: 41.9 % (ref 36.0–46.0)
Hemoglobin: 13.9 g/dL (ref 12.0–15.0)
MCH: 26.3 pg (ref 26.0–34.0)
MCHC: 33.2 g/dL (ref 30.0–36.0)
MCV: 79.2 fL (ref 78.0–100.0)
PLATELETS: 256 10*3/uL (ref 150–400)
RBC: 5.29 MIL/uL — ABNORMAL HIGH (ref 3.87–5.11)
RDW: 16.4 % — AB (ref 11.5–15.5)
WBC: 9.8 10*3/uL (ref 4.0–10.5)

## 2017-02-23 LAB — RAPID URINE DRUG SCREEN, HOSP PERFORMED
AMPHETAMINES: POSITIVE — AB
BARBITURATES: NOT DETECTED
BENZODIAZEPINES: NOT DETECTED
Cocaine: POSITIVE — AB
Opiates: NOT DETECTED
Tetrahydrocannabinol: POSITIVE — AB

## 2017-02-23 LAB — LIPASE, BLOOD: Lipase: 20 U/L (ref 11–51)

## 2017-02-23 LAB — PREGNANCY, URINE: Preg Test, Ur: NEGATIVE

## 2017-02-23 LAB — CBG MONITORING, ED: Glucose-Capillary: 121 mg/dL — ABNORMAL HIGH (ref 65–99)

## 2017-02-23 MED ORDER — SODIUM CHLORIDE 0.9 % IV BOLUS (SEPSIS)
1000.0000 mL | Freq: Once | INTRAVENOUS | Status: AC
  Administered 2017-02-23: 1000 mL via INTRAVENOUS

## 2017-02-23 MED ORDER — HYDROMORPHONE HCL 1 MG/ML IJ SOLN
0.5000 mg | Freq: Once | INTRAMUSCULAR | Status: AC
  Administered 2017-02-23: 0.5 mg via INTRAVENOUS
  Filled 2017-02-23: qty 1

## 2017-02-23 MED ORDER — FAMOTIDINE 20 MG PO TABS: 20.0000 mg | ORAL_TABLET | Freq: Two times a day (BID) | ORAL | 0 refills | Status: AC

## 2017-02-23 MED ORDER — FAMOTIDINE IN NACL 20-0.9 MG/50ML-% IV SOLN
20.0000 mg | Freq: Once | INTRAVENOUS | Status: AC
  Administered 2017-02-23: 20 mg via INTRAVENOUS
  Filled 2017-02-23: qty 50

## 2017-02-23 MED ORDER — LORAZEPAM 2 MG/ML IJ SOLN
1.0000 mg | Freq: Once | INTRAMUSCULAR | Status: AC
  Administered 2017-02-23: 1 mg via INTRAVENOUS
  Filled 2017-02-23: qty 1

## 2017-02-23 MED ORDER — ONDANSETRON 8 MG PO TBDP: 8.0000 mg | ORAL_TABLET | Freq: Three times a day (TID) | ORAL | 0 refills | Status: AC | PRN

## 2017-02-23 MED ORDER — ONDANSETRON HCL 4 MG/2ML IJ SOLN
4.0000 mg | Freq: Once | INTRAMUSCULAR | Status: AC
Start: 2017-02-23 — End: 2017-02-23
  Administered 2017-02-23: 4 mg via INTRAVENOUS
  Filled 2017-02-23: qty 2

## 2017-02-23 NOTE — Discharge Instructions (Signed)
It was our pleasure to provide your ER care today - we hope that you feel better.  Rest. Drink plenty of fluids.  Take zofran as need for nausea.   Follow up with primary care doctor in the coming week.  Your potassium level is mildly low (3.3) - eat plenty of fruits and vegetables, and follow up with primary  care doctor.   Return to ER if worse, persistent vomiting, severe abdominal pain, other concern.

## 2017-02-23 NOTE — ED Provider Notes (Signed)
Please see previous physicians note regarding patient's presenting history and physical, initial ED course, and associated medical decision making.  26 year old female who presents with nausea, vomiting and upper abdominal pain.  Records are reviewed, she has had multiple ED visits to [facility] for recurrent nausea and vomiting.  Recent CT abdomen and pelvis at [facility] 2 weeks ago for similar presentation that was unremarkable.  Her blood work today is reassuring.  She is not pregnant.  Her UDS is notable for cocaine, marijuana.  This is likely cyclic vomiting.  She has no further vomiting after antiemetics and analgesics.  She is requesting pain medications to discharge home with.  After discussing that not it is indicated for cyclic vomiting, she becomes very upset. Discussed that she should follow-up with her PCP regarding this issue.   She has been drinking water and ginger ale and passed her p.o. challenge.  She will be discharged home with antiemetics and Pepcid. Strict return and follow-up instructions reviewed. She expressed understanding of all discharge instructions and felt comfortable with the plan of care.    Lavera GuiseLiu, Marien Manship Duo, MD 02/23/17 [id number]

## 2017-02-23 NOTE — ED Notes (Signed)
Walked Pt. To rest room.  Pt. Urinated for UA and then back to stretcher.  Pt. Was given meds for abd. Pain.  Pt. Unable to pinpoint her pain area.  Pt. Reports she needs medicine for abd. Pain that has been on going for months.  Pt. Now resting with eyes closed and VSS.  No thrashing noted Pt. Calm.

## 2017-02-23 NOTE — ED Notes (Signed)
ED Provider at bedside. 

## 2017-02-23 NOTE — ED Notes (Signed)
Patients she still can not obtain urine sample. Patient given water with no ice per request of the patient. And told call out when she can provide urine sample

## 2017-02-23 NOTE — ED Triage Notes (Addendum)
Vomiting today. Abdominal pain. Pt is sitting on the floor at triage. States her pain is too bad to sit in the chair.

## 2017-02-23 NOTE — ED Provider Notes (Signed)
[facility] EMERGENCY DEPARTMENT Provider Note   CSN: [id number] Arrival date & time: 02/23/17  1223     History   Chief Complaint Chief Complaint  Patient presents with  . Emesis    HPI Ike BeneLakriesha Reh is a 26 y.o. female.  Patient presents c/o upper abd pain and nausea/vomiting, onset in past 1-2 days. Symptoms persistent, moderate, severe. Pt denies specific exacerbating or alleviating factors. Notes similar symptoms in past, unsure of cause. Denies fever or chills. Is having regular bms, loose. Denies vaginal bleeding or discharge. No dysuria or gu c/o. Denies chest pain or sob.    The history is provided by the patient.  Emesis   Associated symptoms include abdominal pain. Pertinent negatives include no chills, no fever and no headaches.    Past Medical History:  Diagnosis Date  . Asthma   . Crohn's disease (HCC)   . Diabetes mellitus without complication (HCC)   . GERD (gastroesophageal reflux disease)   . Pregnancy induced hypertension     There are no active problems to display for this patient.   Past Surgical History:  Procedure Laterality Date  . CESAREAN SECTION    . NO PAST SURGERIES      OB History    Gravida Para Term Preterm AB Living   2 1   1   1    SAB TAB Ectopic Multiple Live Births                   Home Medications    Prior to Admission medications   Medication Sig Start Date End Date Taking? Authorizing Provider  PREDNISONE PO Take by mouth.   Yes [provider]  promethazine (PHENERGAN) 25 MG tablet Take 0.5-1 tablets (12.5-25 mg total) by mouth every 6 (six) hours as needed. 01/31/16   Armando ReichertHogan, Heather D, CNM    Family History No family history on file.  Social History Social History   Tobacco Use  . Smoking status: Current Every Day Smoker  . Smokeless tobacco: Never Used  Substance Use Topics  . Alcohol use: No  . Drug use: No     Allergies   Penicillins   Review of Systems Review of  Systems  Constitutional: Negative for chills and fever.  HENT: Negative for sore throat.   Eyes: Negative for redness.  Respiratory: Negative for shortness of breath.   Cardiovascular: Negative for chest pain.  Gastrointestinal: Positive for abdominal pain, nausea and vomiting.  Genitourinary: Negative for dysuria and flank pain.  Musculoskeletal: Negative for back pain and neck pain.  Skin: Negative for rash.  Neurological: Negative for headaches.  Hematological: Does not bruise/bleed easily.  Psychiatric/Behavioral: Negative for confusion.     Physical Exam Updated Vital Signs BP (!) 148/74   Pulse 74   Temp 98.4 F (36.9 C) (Oral)   Resp 16   Ht 1.575 m (5\' 2" )   Wt 91.2 kg (201 lb)   LMP 02/09/2017   SpO2 97%   BMI 36.76 kg/m   Physical Exam  Constitutional: She appears well-developed and well-nourished. No distress.  HENT:  Mouth/Throat: Oropharynx is clear and moist.  Eyes: Conjunctivae are normal. No scleral icterus.  Neck: Neck supple. No tracheal deviation present.  Cardiovascular: Normal rate, regular rhythm, normal heart sounds and intact distal pulses.  Pulmonary/Chest: Effort normal and breath sounds normal. No respiratory distress.  Abdominal: Soft. Normal appearance and bowel sounds are normal. She exhibits no distension and no mass. There is no tenderness. There  is no rebound and no guarding. No hernia.  Genitourinary:  Genitourinary Comments: No cva tenderness  Musculoskeletal: She exhibits no edema.  Neurological: She is alert.  Skin: Skin is warm and dry. No rash noted.  Psychiatric: She has a normal mood and affect.  Nursing note and vitals reviewed.    ED Treatments / Results  Labs (all labs ordered are listed, but only abnormal results are displayed) Results for orders placed or performed during the hospital encounter of 02/23/17  CBC  Result Value Ref Range   WBC 9.8 4.0 - 10.5 K/uL   RBC 5.29 (H) 3.87 - 5.11 MIL/uL   Hemoglobin 13.9  12.0 - 15.0 g/dL   HCT 40.941.9 81.136.0 - 91.446.0 %   MCV 79.2 78.0 - 100.0 fL   MCH 26.3 26.0 - 34.0 pg   MCHC 33.2 30.0 - 36.0 g/dL   RDW 78.216.4 (H) 95.611.5 - 21.315.5 %   Platelets 256 150 - 400 K/uL  Comprehensive metabolic panel  Result Value Ref Range   Sodium 134 (L) 135 - 145 mmol/L   Potassium 3.3 (L) 3.5 - 5.1 mmol/L   Chloride 100 (L) 101 - 111 mmol/L   CO2 22 22 - 32 mmol/L   Glucose, Bld 126 (H) 65 - 99 mg/dL   BUN 6 6 - 20 mg/dL   Creatinine, Ser 0.860.92 0.44 - 1.00 mg/dL   Calcium 9.0 8.9 - 57.810.3 mg/dL   Total Protein 7.7 6.5 - 8.1 g/dL   Albumin 4.0 3.5 - 5.0 g/dL   AST 24 15 - 41 U/L   ALT 22 14 - 54 U/L   Alkaline Phosphatase 63 38 - 126 U/L   Total Bilirubin 0.6 0.3 - 1.2 mg/dL   GFR calc non Af Amer >60 >60 mL/min   GFR calc Af Amer >60 >60 mL/min   Anion gap 12 5 - 15  Lipase, blood  Result Value Ref Range   Lipase 20 11 - 51 U/L  POC CBG, ED  Result Value Ref Range   Glucose-Capillary 121 (H) 65 - 99 mg/dL   Comment 1 Notify RN   EKG  EKG Interpretation None       Radiology No results found.  Procedures Procedures (including critical care time)  Medications Ordered in ED Medications  sodium chloride 0.9 % bolus 1,000 mL (not administered)  famotidine (PEPCID) IVPB 20 mg premix (not administered)  ondansetron (ZOFRAN) injection 4 mg (not administered)  LORazepam (ATIVAN) injection 1 mg (not administered)     Initial Impression / Assessment and Plan / ED Course  I have reviewed the triage vital signs and the nursing notes.  Pertinent labs & imaging results that were available during my care of the patient were reviewed by me and considered in my medical decision making (see chart for details).  Iv ns bolus. Labs.   Reviewed nursing notes and prior charts for additional history.   zofran iv. pepcid iv.   Pt anxious. Ativan 1 mg iv.   Recheck, pt calmer, more comfortable appearing.  Additional ns iv.  Pt requests pain med. Dilaudid .5 mg  iv.  Recheck abd soft nt.   No recurrent emesis.  Trial of po fluids.  u preg and po trial pending.  Signed out to Dr Verdie MosherLiu - anticipate probable d/c post ivf if tolerating po.   Final Clinical Impressions(s) / ED Diagnoses   Final diagnoses:  None    ED Discharge Orders    None  Cathren Laine, MD 02/23/17 [phone]

## 2017-02-23 NOTE — ED Notes (Signed)
Patient is A & O x4.  She understood AVS instructions.  

## 2017-02-23 NOTE — ED Notes (Signed)
Pt laying on floor in rm 7, c/o abd pain and unable to get up , also c/o unable to change into gown, pt assisted with one assist w/o difficulty to bed and changed to gown.

## 2018-03-02 IMAGING — US US MFM OB LIMITED
1 series · 14 of 28 positions shown · non-contrast
Comparison: none

[Series 1: us mfm ob limited · 14 of 29 slices shown]
[im 2/29]
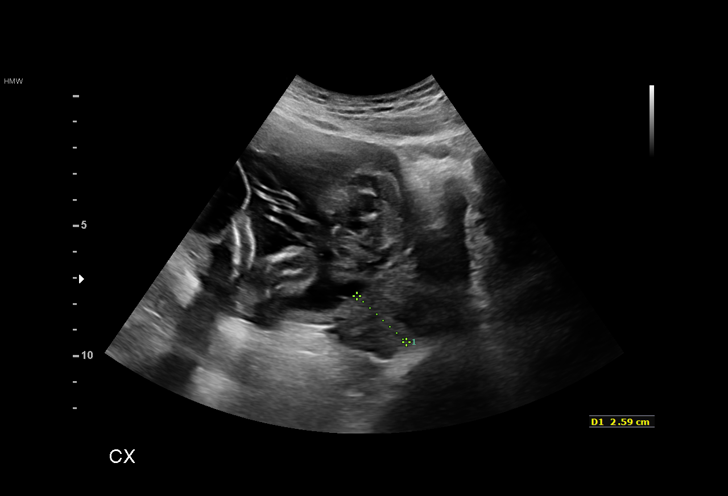
[im 4/29]
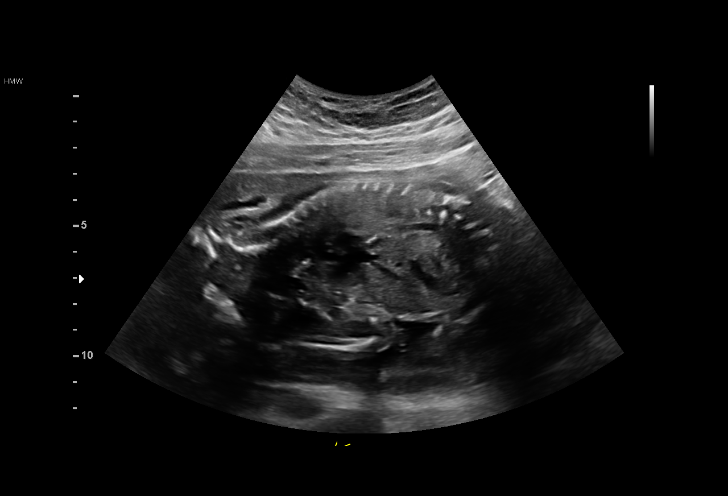
[im 6/29]
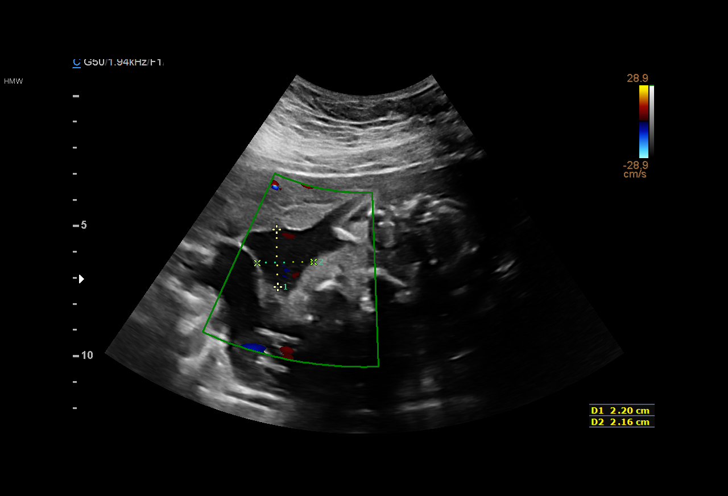
[im 8/29]
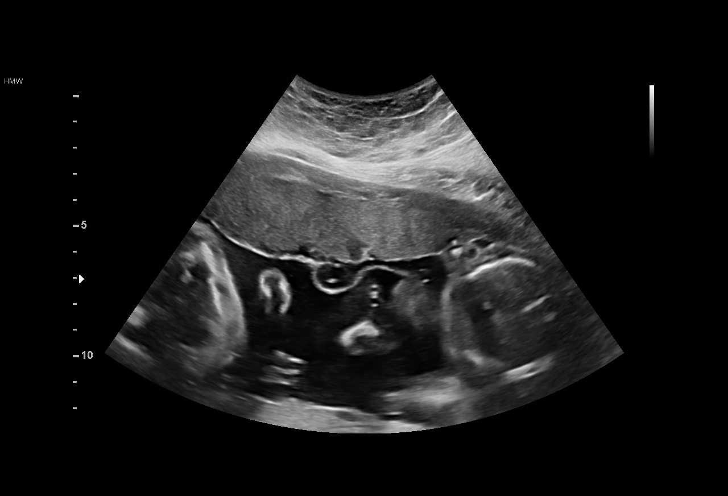
[im 10/29]
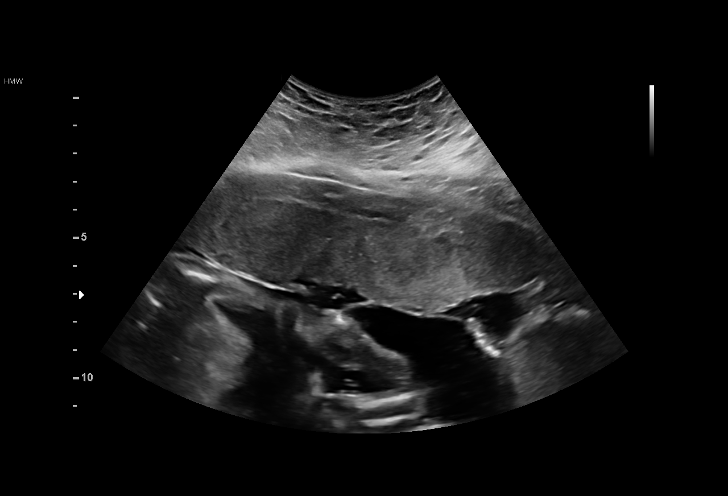
[im 12/29]
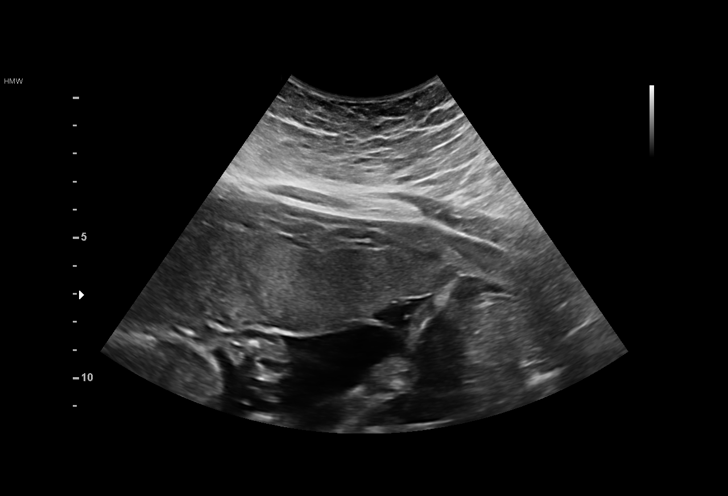
[im 14/29]
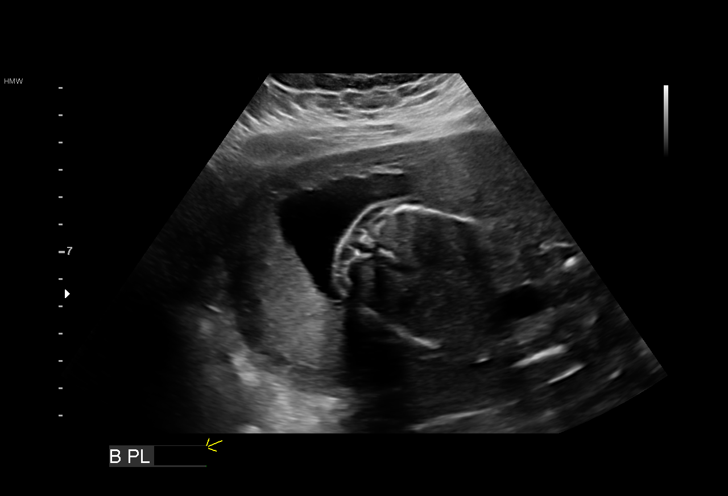
[im 16/29]
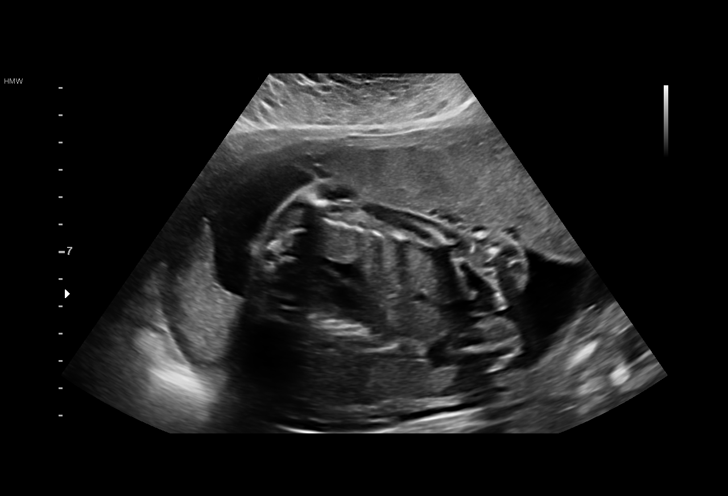
[im 18/29]
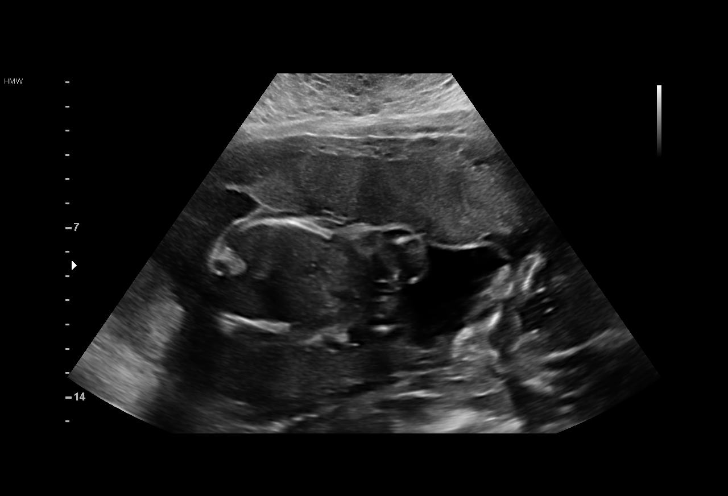
[im 20/29]
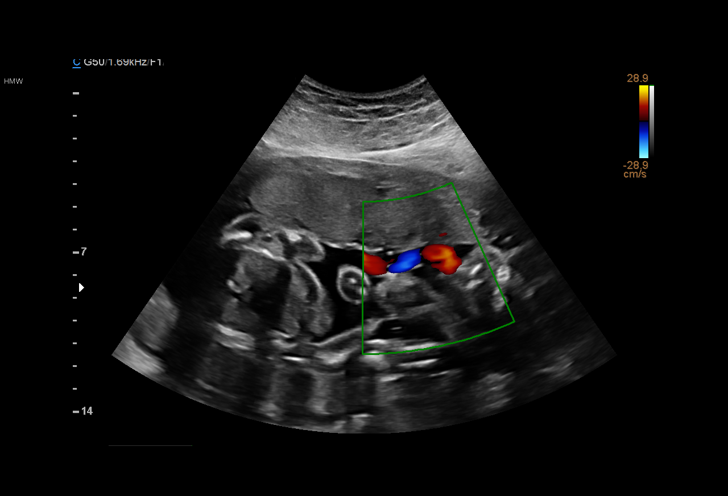
[im 22/29]
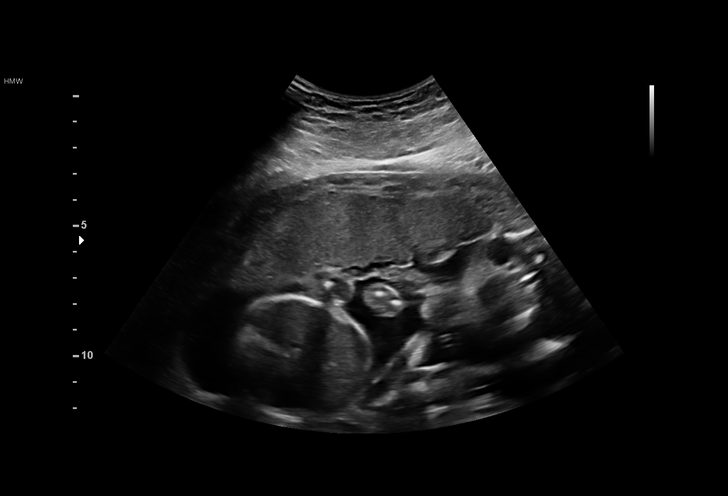
[im 24/29]
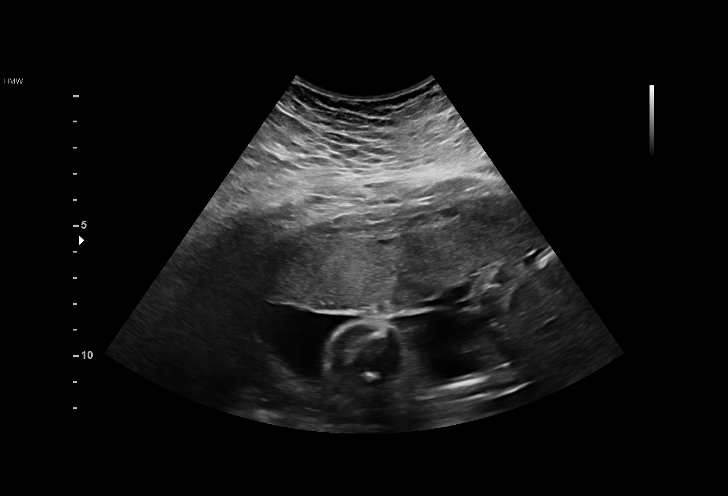
[im 26/29]
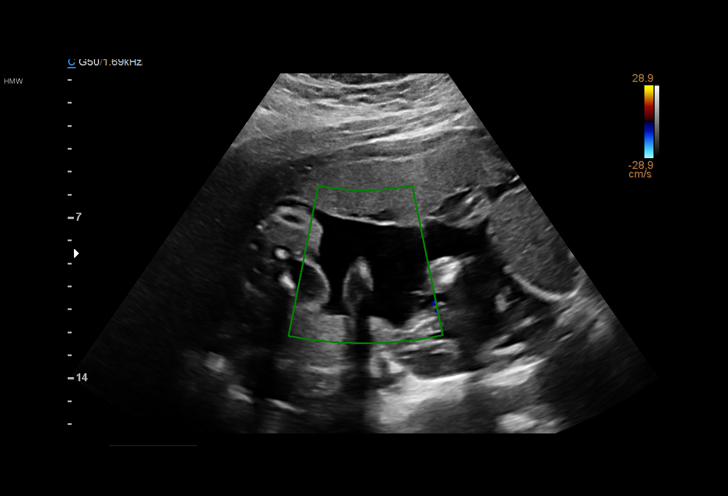
[im 29/29]
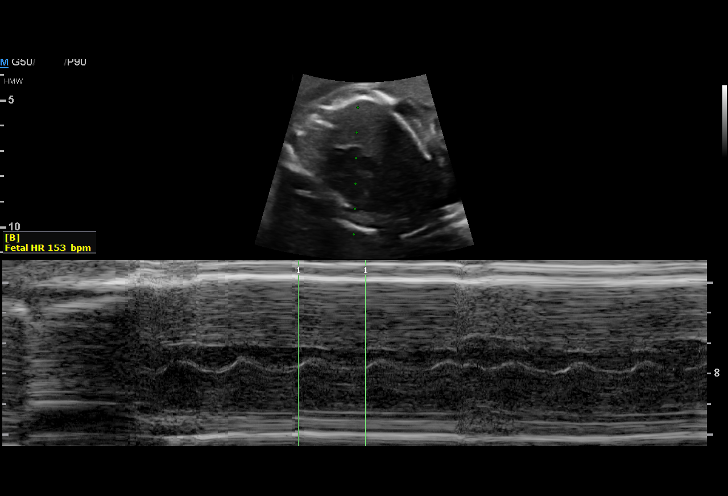

[14 of 28 positions shown; findings below may reference images not displayed]

pm)

1  FRDRIC TZI            [id number]      [id number]     [id number]
Indications

22 weeks gestation of pregnancy
Twin pregnancy, di/di, second trimester
Vaginal bleeding in pregnancy, second
trimester
OB History

Gravidity:    2         Prem:   1
Living:       1
Fetal Evaluation (Fetus A)

Num Of Fetuses:     2
Fetal Heart         155
Rate(bpm):
Cardiac Activity:   Observed
Fetal Lie:          Maternal left side
Presentation:       Breech
Placenta:           Anterior, above cervical os
P. Cord Insertion:  Not well visualized
Membrane Desc:      Dividing Membrane seen

Amniotic Fluid
AFI FV:      Subjectively within normal limits

Largest Pocket(cm)
2.2

Comment:    No placental abruption or previa identified.
Gestational Age (Fetus A)

LMP:           22w 4d        Date:  12/14/15                 EDD:   09/19/16
Best:          22w 4d     Det. By:  LMP  (12/14/15)          EDD:   09/19/16
Fetal Evaluation (Fetus B)

Num Of Fetuses:     2
Fetal Heart         153
Rate(bpm):
Cardiac Activity:   Observed
Fetal Lie:          Maternal right side
Presentation:       Transverse, head to maternal left
Placenta:           Posterior, above cervical os
P. Cord Insertion:  Visualized, central
Membrane Desc:      Dividing Membrane seen

Amniotic Fluid
AFI FV:      Subjectively within normal limits

Largest Pocket(cm)
3.6
Gestational Age (Fetus B)

LMP:           22w 4d        Date:  12/14/15                 EDD:   09/19/16
Best:          22w 4d     Det. By:  LMP  (12/14/15)          EDD:   09/19/16
Cervix Uterus Adnexa

Cervix
Length:            2.7  cm.
Normal appearance by transabdominal scan.

Uterus
No abnormality visualized.

Left Ovary
No adnexal mass visualized.

Right Ovary
No adnexal mass visualized.

Cul De Sac:   No free fluid seen.

Adnexa:       No abnormality visualized.
Impression

Dichorionic / diamniotic twin gestation intrauterine pregnancy
at 22 weesk 4 days gestation with fetal cardiac activity x 2
Breech presentation / transverse
Normal appearing amniotic fluid volume x2
Cervical length 
2.7 cm
No sonographic evidence apparent for intrauterine bleeding
Recommendations

Follow-up ultrasounds as clinically indicated.

## 2023-12-10 DIAGNOSIS — F3131 Bipolar disorder, current episode depressed, mild: Secondary | ICD-10-CM | POA: Diagnosis not present

## 2023-12-10 DIAGNOSIS — Z634 Disappearance and death of family member: Secondary | ICD-10-CM | POA: Diagnosis not present

## 2023-12-17 DIAGNOSIS — F3131 Bipolar disorder, current episode depressed, mild: Secondary | ICD-10-CM | POA: Diagnosis not present

## 2023-12-20 DIAGNOSIS — G8929 Other chronic pain: Secondary | ICD-10-CM | POA: Diagnosis not present

## 2023-12-20 DIAGNOSIS — R1011 Right upper quadrant pain: Secondary | ICD-10-CM | POA: Diagnosis not present

## 2023-12-20 DIAGNOSIS — M5440 Lumbago with sciatica, unspecified side: Secondary | ICD-10-CM | POA: Diagnosis not present

## 2023-12-20 DIAGNOSIS — M5441 Lumbago with sciatica, right side: Secondary | ICD-10-CM | POA: Diagnosis not present

## 2024-01-01 DIAGNOSIS — K760 Fatty (change of) liver, not elsewhere classified: Secondary | ICD-10-CM | POA: Diagnosis not present

## 2024-01-01 DIAGNOSIS — R1011 Right upper quadrant pain: Secondary | ICD-10-CM | POA: Diagnosis not present

## 2024-01-14 DIAGNOSIS — R32 Unspecified urinary incontinence: Secondary | ICD-10-CM | POA: Diagnosis not present

## 2024-01-28 ENCOUNTER — Other Ambulatory Visit: Payer: Self-pay

## 2024-01-28 ENCOUNTER — Emergency Department (HOSPITAL_COMMUNITY)
Admission: EM | Admit: 2024-01-28 | Discharge: 2024-01-28 | Disposition: A | Discharge status: Home / Self Care | Attending: Emergency Medicine | Admitting: Emergency Medicine

## 2024-01-28 ENCOUNTER — Emergency Department (HOSPITAL_COMMUNITY)

## 2024-01-28 ENCOUNTER — Encounter (HOSPITAL_COMMUNITY): Payer: Self-pay

## 2024-01-28 DIAGNOSIS — S8002XA Contusion of left knee, initial encounter: Secondary | ICD-10-CM | POA: Insufficient documentation

## 2024-01-28 DIAGNOSIS — S63502A Unspecified sprain of left wrist, initial encounter: Secondary | ICD-10-CM | POA: Insufficient documentation

## 2024-01-28 DIAGNOSIS — M25532 Pain in left wrist: Secondary | ICD-10-CM | POA: Diagnosis present

## 2024-01-28 DIAGNOSIS — S5002XA Contusion of left elbow, initial encounter: Secondary | ICD-10-CM | POA: Insufficient documentation

## 2024-01-28 DIAGNOSIS — W010XXA Fall on same level from slipping, tripping and stumbling without subsequent striking against object, initial encounter: Secondary | ICD-10-CM | POA: Diagnosis not present

## 2024-01-28 DIAGNOSIS — Y92009 Unspecified place in unspecified non-institutional (private) residence as the place of occurrence of the external cause: Secondary | ICD-10-CM | POA: Diagnosis not present

## 2024-01-28 MED ORDER — ONDANSETRON 8 MG PO TBDP
8.0000 mg | ORAL_TABLET | Freq: Once | ORAL | Status: AC
  Administered 2024-01-28: 8 mg via ORAL
  Filled 2024-01-28: qty 1

## 2024-01-28 MED ORDER — IBUPROFEN 600 MG PO TABS: 600.0000 mg | ORAL_TABLET | Freq: Four times a day (QID) | ORAL | 0 refills | Status: DC | PRN

## 2024-01-28 MED ORDER — HYDROCODONE-ACETAMINOPHEN 5-325 MG PO TABS
2.0000 | ORAL_TABLET | Freq: Once | ORAL | Status: AC
  Administered 2024-01-28: 2 via ORAL
  Filled 2024-01-28: qty 2

## 2024-01-28 NOTE — ED Provider Notes (Signed)
 [place] EMERGENCY DEPARTMENT AT [facility] Provider Note   CSN: [id number] Arrival date & time: 01/28/24  1037     Patient presents with: Fall, Wrist Pain, and Knee Pain   Olivia Jarvis is a 33 y.o. female who had a trip and fall at home, landing onto her left side with a outstretched hand on the left, primary complaints are of pain to the left wrist, forearm, and elbow.  She also complains of pain from the superior aspect of the left knee distally.  Denies having any head impact, denies loss of consciousness, denies having any dizziness, does endorse some nausea that is induced with provocation of her pain.  Last oral intake was yesterday evening prior to the fall.  Has not taken any medications prior to arrival.    Fall  Wrist Pain  Knee Pain      Prior to Admission medications   Medication Sig Start Date End Date Taking? Authorizing Provider  ibuprofen  (ADVIL ) 600 MG tablet Take 1 tablet (600 mg total) by mouth every 6 (six) hours as needed. 01/28/24  Yes Myriam Dorn BROCKS, PA  famotidine  (PEPCID ) 20 MG tablet Take 1 tablet (20 mg total) by mouth 2 (two) times daily. 02/23/17   Arminda Lonell Narrow, MD  ondansetron  (ZOFRAN  ODT) 8 MG disintegrating tablet Take 1 tablet (8 mg total) by mouth every 8 (eight) hours as needed for nausea or vomiting. 02/23/17   Steinl, Kevin, MD  PREDNISONE PO Take by mouth.    [provider]  promethazine  (PHENERGAN ) 25 MG tablet Take 0.5-1 tablets (12.5-25 mg total) by mouth every 6 (six) hours as needed. 01/31/16   Edmundo Moats D, CNM    Allergies: Penicillins    Review of Systems  Musculoskeletal:  Positive for arthralgias and joint swelling.  All other systems reviewed and are negative.   Updated Vital Signs BP 114/80   Pulse 96   Temp 98.3 F (36.8 C)   Resp 17   LMP 01/25/2024 (Approximate)   SpO2 98%   Physical Exam Vitals and nursing note reviewed.  Constitutional:      General: She is not in acute  distress.    Appearance: Normal appearance.  HENT:     Head: Normocephalic and atraumatic.     Mouth/Throat:     Mouth: Mucous membranes are moist.     Pharynx: Oropharynx is clear.  Eyes:     Extraocular Movements: Extraocular movements intact.     Conjunctiva/sclera: Conjunctivae normal.     Pupils: Pupils are equal, round, and reactive to light.  Cardiovascular:     Rate and Rhythm: Normal rate and regular rhythm.     Pulses: Normal pulses.          Dorsalis pedis pulses are 2+ on the right side and 2+ on the left side.       Posterior tibial pulses are 2+ on the right side and 2+ on the left side.     Heart sounds: Normal heart sounds. No murmur heard.    No friction rub. No gallop.  Pulmonary:     Effort: Pulmonary effort is normal.     Breath sounds: Normal breath sounds.  Abdominal:     General: Abdomen is flat. Bowel sounds are normal.     Palpations: Abdomen is soft.  Musculoskeletal:        General: Normal range of motion.     Right shoulder: Normal.     Left shoulder: Normal.  Right elbow: Normal.     Left elbow: No swelling or deformity. Tenderness present in lateral epicondyle and olecranon process.     Right forearm: Normal.     Left forearm: Normal.     Right wrist: Normal.     Left wrist: Tenderness present. No snuff box tenderness or crepitus.     Cervical back: Normal range of motion and neck supple.     Right lower leg: No edema.     Left lower leg: No edema.     Right ankle: Normal.     Right Achilles Tendon: Normal.     Left ankle: Tenderness present over the lateral malleolus.     Left Achilles Tendon: Normal.     Comments: Tenderness to palpation of the lateral wrist.  No snuffbox tenderness appreciated.  Decreased extension of the left hand secondary to increased pain, also has pain with passive supination of the left wrist.  There is pain and swelling to the lateral malleolus, along with decreased dorsiflexion of the left foot secondary to pain.   Skin:    General: Skin is warm and dry.     Capillary Refill: Capillary refill takes less than 2 seconds.  Neurological:     General: No focal deficit present.     Mental Status: She is alert. Mental status is at baseline.  Psychiatric:        Mood and Affect: Mood normal.     (all labs ordered are listed, but only abnormal results are displayed) Labs Reviewed - No data to display  EKG: None  Radiology: DG Wrist Complete Left Result Date: 01/28/2024 EXAM: 3 OR MORE VIEW(S) XRAY OF THE LEFT WRIST 01/28/2024 12:01:13 PM COMPARISON: None available. CLINICAL HISTORY: Pain in left elbow with lateral tenderness, pain with pronation supination of the forearm. FINDINGS: BONES AND JOINTS: No acute fracture. No focal osseous lesion. No joint dislocation. SOFT TISSUES: The soft tissues are unremarkable. IMPRESSION: 1. No acute osseous abnormality of the wrist identified. Electronically signed by: Waddell Calk MD 01/28/2024 01:56 PM EST RP Workstation: [id number]   DG Tibia/Fibula Left Result Date: 01/28/2024 EXAM: _VIEWS_ VIEW(S) XRAY OF THE LEFT TIBIA AND FIBULA 01/28/2024 12:01:13 PM COMPARISON: None available. CLINICAL HISTORY: Superior and lateral tenderness and pain of the left knee. FINDINGS: BONES AND JOINTS: No acute fracture. No focal osseous lesion. No joint dislocation. SOFT TISSUES: Diffuse soft tissue swelling of the left distal lower leg and ankle. IMPRESSION: 1. Diffuse soft tissue swelling of the left distal lower leg and ankle. 2. No acute osseous findings. Electronically signed by: Waddell Calk MD 01/28/2024 01:55 PM EST RP Workstation: [id number]   DG Knee Complete 4 Views Left Result Date: 01/28/2024 EXAM: 4 VIEW(S) XRAY OF THE LEFT KNEE 01/28/2024 12:01:13 PM COMPARISON: None available. CLINICAL HISTORY: Superior and lateral tenderness and pain of the left knee. FINDINGS: BONES AND JOINTS: No acute fracture. No focal osseous lesion. No joint dislocation. No significant joint  effusion. No significant degenerative changes. SOFT TISSUES: The soft tissues are unremarkable. IMPRESSION: 1. No acute abnormality of the knee detected. Electronically signed by: Waddell Calk MD 01/28/2024 01:54 PM EST RP Workstation: [id number]   DG Forearm Left Result Date: 01/28/2024 EXAM: VIEW(S) XRAY OF THE LEFT FOREARM 01/28/2024 12:01:13 PM COMPARISON: None available. CLINICAL HISTORY: Pain in left elbow with lateral tenderness, pain with pronation supination of the forearm. FINDINGS: BONES AND JOINTS: Possible elevation of the anterior fat pad indicative of a joint effusion. Elbow joint effusion in the setting of  trauma may reflect occult radial head fracture in the adult population. SOFT TISSUES: Soft tissue swelling of the left posterior elbow. IMPRESSION: 1. Possible elbow joint effusion which may reflect occult radial head fracture in the setting of trauma. 2. Soft tissue swelling of the left posterior elbow. Electronically signed by: Waddell Calk MD 01/28/2024 01:52 PM EST RP Workstation: [id number]   DG Ankle Complete Left Result Date: 01/28/2024 EXAM: 3 OR MORE VIEW(S) XRAY OF THE LEFT ANKLE 01/28/2024 12:01:13 PM CLINICAL HISTORY: Superior and lateral tenderness and pain of the left knee COMPARISON: None available. FINDINGS: BONES AND JOINTS: Study limited as the posterior aspect of the calcaneus is collimated off the field of view. No acute fracture. No focal osseous lesion. No joint dislocation. Small left knee joint effusion. SOFT TISSUES: Diffuse soft tissue swelling about the left ankle. IMPRESSION: 1. Diffuse soft tissue swelling about the left ankle. 2. No acute fracture. Electronically signed by: Waddell Calk MD 01/28/2024 01:50 PM EST RP Workstation: [id number]   DG Elbow Complete Left Result Date: 01/28/2024 EXAM: 3 VIEW(S) XRAY OF THE LEFT ELBOW COMPARISON: None available. CLINICAL HISTORY: FINDINGS: BONES AND JOINTS: No fracture or dislocation identified. No focal osseous  lesion. No significant arthropathy. Suboptimal lateral projection radiograph, which diminishes sensitivity for detecting underlying joint effusion. SOFT TISSUES: The soft tissues are unremarkable. IMPRESSION: 1. No acute abnormality. 2. Suboptimal lateral projection limits evaluation for joint effusion detection. Electronically signed by: Waddell Calk MD 01/28/2024 01:43 PM EST RP Workstation: [id number]     Procedures   Medications Ordered in the ED  HYDROcodone-acetaminophen  (NORCO/VICODIN) 5-325 MG per tablet 2 tablet (2 tablets Oral Given 01/28/24 1201)  ondansetron  (ZOFRAN -ODT) disintegrating tablet 8 mg (8 mg Oral Given 01/28/24 1202)                                    Medical Decision Making Amount and/or Complexity of Data Reviewed Radiology: ordered.  Risk Prescription drug management.   Medical Decision Making:   Rosabell Geyer is a 33 y.o. female who presented to the ED today with various complaints including left ankle pain, left knee pain, left forearm pain, left wrist pain detailed above.     Complete initial physical exam performed, notably the patient  was alert and oriented in no apparent distress.  Physical exam findings as noted, no acute deformities are appreciated exam..    Reviewed and confirmed nursing documentation for past medical history, family history, social history.    Initial Assessment:   With the patient's presentation of multiple areas of pain secondary to fall, consider possible sprain and/or contusion to the affected areas, also consider possible fracture and/or dislocation to the same.  Initial Plan:  Plain film imaging to be obtained of the left wrist, left forearm, left elbow. Plain film imaging to be obtained of left knee, left tib-fib, left ankle. Initial pain management with Norco and manage nausea with ondansetron . Objective evaluation as below reviewed   Initial Study Results:   Radiology:  All images reviewed independently. Agree  with radiology report at this time.   DG Wrist Complete Left Result Date: 01/28/2024 EXAM: 3 OR MORE VIEW(S) XRAY OF THE LEFT WRIST 01/28/2024 12:01:13 PM COMPARISON: None available. CLINICAL HISTORY: Pain in left elbow with lateral tenderness, pain with pronation supination of the forearm. FINDINGS: BONES AND JOINTS: No acute fracture. No focal osseous lesion. No joint dislocation. SOFT TISSUES: The soft tissues are unremarkable.  IMPRESSION: 1. No acute osseous abnormality of the wrist identified. Electronically signed by: Waddell Calk MD 01/28/2024 01:56 PM EST RP Workstation: [id number]   DG Tibia/Fibula Left Result Date: 01/28/2024 EXAM: _VIEWS_ VIEW(S) XRAY OF THE LEFT TIBIA AND FIBULA 01/28/2024 12:01:13 PM COMPARISON: None available. CLINICAL HISTORY: Superior and lateral tenderness and pain of the left knee. FINDINGS: BONES AND JOINTS: No acute fracture. No focal osseous lesion. No joint dislocation. SOFT TISSUES: Diffuse soft tissue swelling of the left distal lower leg and ankle. IMPRESSION: 1. Diffuse soft tissue swelling of the left distal lower leg and ankle. 2. No acute osseous findings. Electronically signed by: Waddell Calk MD 01/28/2024 01:55 PM EST RP Workstation: [id number]   DG Knee Complete 4 Views Left Result Date: 01/28/2024 EXAM: 4 VIEW(S) XRAY OF THE LEFT KNEE 01/28/2024 12:01:13 PM COMPARISON: None available. CLINICAL HISTORY: Superior and lateral tenderness and pain of the left knee. FINDINGS: BONES AND JOINTS: No acute fracture. No focal osseous lesion. No joint dislocation. No significant joint effusion. No significant degenerative changes. SOFT TISSUES: The soft tissues are unremarkable. IMPRESSION: 1. No acute abnormality of the knee detected. Electronically signed by: Waddell Calk MD 01/28/2024 01:54 PM EST RP Workstation: [id number]   DG Forearm Left Result Date: 01/28/2024 EXAM: VIEW(S) XRAY OF THE LEFT FOREARM 01/28/2024 12:01:13 PM COMPARISON: None available.  CLINICAL HISTORY: Pain in left elbow with lateral tenderness, pain with pronation supination of the forearm. FINDINGS: BONES AND JOINTS: Possible elevation of the anterior fat pad indicative of a joint effusion. Elbow joint effusion in the setting of trauma may reflect occult radial head fracture in the adult population. SOFT TISSUES: Soft tissue swelling of the left posterior elbow. IMPRESSION: 1. Possible elbow joint effusion which may reflect occult radial head fracture in the setting of trauma. 2. Soft tissue swelling of the left posterior elbow. Electronically signed by: Waddell Calk MD 01/28/2024 01:52 PM EST RP Workstation: [id number]   DG Ankle Complete Left Result Date: 01/28/2024 EXAM: 3 OR MORE VIEW(S) XRAY OF THE LEFT ANKLE 01/28/2024 12:01:13 PM CLINICAL HISTORY: Superior and lateral tenderness and pain of the left knee COMPARISON: None available. FINDINGS: BONES AND JOINTS: Study limited as the posterior aspect of the calcaneus is collimated off the field of view. No acute fracture. No focal osseous lesion. No joint dislocation. Small left knee joint effusion. SOFT TISSUES: Diffuse soft tissue swelling about the left ankle. IMPRESSION: 1. Diffuse soft tissue swelling about the left ankle. 2. No acute fracture. Electronically signed by: Waddell Calk MD 01/28/2024 01:50 PM EST RP Workstation: [id number]   DG Elbow Complete Left Result Date: 01/28/2024 EXAM: 3 VIEW(S) XRAY OF THE LEFT ELBOW COMPARISON: None available. CLINICAL HISTORY: FINDINGS: BONES AND JOINTS: No fracture or dislocation identified. No focal osseous lesion. No significant arthropathy. Suboptimal lateral projection radiograph, which diminishes sensitivity for detecting underlying joint effusion. SOFT TISSUES: The soft tissues are unremarkable. IMPRESSION: 1. No acute abnormality. 2. Suboptimal lateral projection limits evaluation for joint effusion detection. Electronically signed by: Waddell Calk MD 01/28/2024 01:43 PM EST RP  Workstation: [id number]    Reassessment and Plan:   The imaging does demonstrate swelling on the left elbow that states that they cannot rule out possible occult radial head fracture.  As a result of this we will apply a sling to the left arm, along with a wrist brace for the left wrist for likely wrist sprain given that there is no findings of acute fracture on imaging.  Further, apply Ace wrap to the left  ankle secondary to likely sprain of the same.  Crutches to be provided for ambulatory assistance.  This was thoroughly discussed with the patient along with using ice packs and ibuprofen /Tylenol  as needed for pain.  She understands agrees has no further concerns at this time.  As are no concerning findings on reassessment, will discharge with outpatient follow-up, referral provided for orthopedic follow-up given findings on x-ray of the left elbow.       Final diagnoses:  Sprain of left wrist, unspecified location, initial encounter  Contusion of left elbow, initial encounter  Contusion of left knee, initial encounter    ED Discharge Orders          Ordered    ibuprofen  (ADVIL ) 600 MG tablet  Every 6 hours PRN        01/28/24 1449               Myriam Dorn BROCKS, [place] 01/28/24 1551    Levander Houston, MD 01/29/24 1337

## 2024-01-28 NOTE — ED Triage Notes (Signed)
 PT arrives via POV. Pt tripped and fell last night. PT c/o pain to left wrist, left forearm, and left knee. Denies head injury. Pt is AxOx4.

## 2024-01-28 NOTE — Progress Notes (Signed)
 Orthopedic Tech Progress Note Patient Details:  Olivia Jarvis September 19, 1990 [id number] Applied knee sleeve, sling immobilizer, and wrist brace per order. Pt insisted on application of CAM boot. Ortho Devices Type of Ortho Device: CAM walker, Crutches, Knee Sleeve, Sling immobilizer, Velcro wrist splint Ortho Device/Splint Location: LLE, LUE Ortho Device/Splint Interventions: Ordered, Application, Adjustment   Post Interventions Patient Tolerated: Well Instructions Provided: Adjustment of device, Care of device, Poper ambulation with device  Morna Pink 01/28/2024, 3:50 PM

## 2024-01-28 NOTE — ED Notes (Signed)
 Ortho tech called for sleeves/braces. Ace wrap applied to patient's left ankle. Patient and children given sandwiches.

## 2024-02-20 DIAGNOSIS — R32 Unspecified urinary incontinence: Secondary | ICD-10-CM | POA: Diagnosis not present

## 2024-03-06 ENCOUNTER — Emergency Department (HOSPITAL_COMMUNITY)
Admission: EM | Admit: 2024-03-06 | Discharge: 2024-03-06 | Disposition: A | Discharge status: Home / Self Care | Attending: Emergency Medicine | Admitting: Emergency Medicine

## 2024-03-06 ENCOUNTER — Encounter (HOSPITAL_COMMUNITY): Payer: Self-pay

## 2024-03-06 ENCOUNTER — Emergency Department (HOSPITAL_COMMUNITY)

## 2024-03-06 ENCOUNTER — Other Ambulatory Visit: Payer: Self-pay

## 2024-03-06 DIAGNOSIS — K047 Periapical abscess without sinus: Secondary | ICD-10-CM | POA: Insufficient documentation

## 2024-03-06 DIAGNOSIS — E119 Type 2 diabetes mellitus without complications: Secondary | ICD-10-CM | POA: Diagnosis not present

## 2024-03-06 DIAGNOSIS — R221 Localized swelling, mass and lump, neck: Secondary | ICD-10-CM | POA: Diagnosis not present

## 2024-03-06 DIAGNOSIS — K0889 Other specified disorders of teeth and supporting structures: Secondary | ICD-10-CM | POA: Insufficient documentation

## 2024-03-06 DIAGNOSIS — J45909 Unspecified asthma, uncomplicated: Secondary | ICD-10-CM | POA: Diagnosis not present

## 2024-03-06 DIAGNOSIS — R220 Localized swelling, mass and lump, head: Secondary | ICD-10-CM | POA: Diagnosis not present

## 2024-03-06 LAB — CBC WITH DIFFERENTIAL/PLATELET
Abs Immature Granulocytes: 0.01 K/uL (ref 0.00–0.07)
Basophils Absolute: 0 K/uL (ref 0.0–0.1)
Basophils Relative: 0 %
Eosinophils Absolute: 0.1 K/uL (ref 0.0–0.5)
Eosinophils Relative: 1 %
HCT: 42.7 % (ref 36.0–46.0)
Hemoglobin: 13.4 g/dL (ref 12.0–15.0)
Immature Granulocytes: 0 %
Lymphocytes Relative: 23 %
Lymphs Abs: 1.9 K/uL (ref 0.7–4.0)
MCH: 26.4 pg (ref 26.0–34.0)
MCHC: 31.4 g/dL (ref 30.0–36.0)
MCV: 84.1 fL (ref 80.0–100.0)
Monocytes Absolute: 0.5 K/uL (ref 0.1–1.0)
Monocytes Relative: 6 %
Neutro Abs: 5.6 K/uL (ref 1.7–7.7)
Neutrophils Relative %: 70 %
Platelets: 251 K/uL (ref 150–400)
RBC: 5.08 MIL/uL (ref 3.87–5.11)
RDW: 15 % (ref 11.5–15.5)
WBC: 8.1 K/uL (ref 4.0–10.5)
nRBC: 0 % (ref 0.0–0.2)

## 2024-03-06 LAB — BASIC METABOLIC PANEL WITH GFR
Anion gap: 9 (ref 5–15)
BUN: 9 mg/dL (ref 6–20)
CO2: 26 mmol/L (ref 22–32)
Calcium: 9.3 mg/dL (ref 8.9–10.3)
Chloride: 104 mmol/L (ref 98–111)
Creatinine, Ser: 1 mg/dL (ref 0.44–1.00)
GFR, Estimated: >60 mL/min (ref >60)
Glucose, Bld: 126 mg/dL — ABNORMAL HIGH (ref 70–99)
Potassium: 4 mmol/L (ref 3.5–5.1)
Sodium: 139 mmol/L (ref 135–145)

## 2024-03-06 LAB — I-STAT CHEM 8, ED
BUN: 8 mg/dL (ref 6–20)
Calcium, Ion: 1.13 mmol/L — ABNORMAL LOW (ref 1.15–1.40)
Chloride: 103 mmol/L (ref 98–111)
Creatinine, Ser: 1.1 mg/dL — ABNORMAL HIGH (ref 0.44–1.00)
Glucose, Bld: 125 mg/dL — ABNORMAL HIGH (ref 70–99)
HCT: 43 % (ref 36.0–46.0)
Hemoglobin: 14.6 g/dL (ref 12.0–15.0)
Potassium: 4 mmol/L (ref 3.5–5.1)
Sodium: 140 mmol/L (ref 135–145)
TCO2: 26 mmol/L (ref 22–32)

## 2024-03-06 LAB — HCG, SERUM, QUALITATIVE: Preg, Serum: NEGATIVE

## 2024-03-06 MED ORDER — IOHEXOL 300 MG/ML  SOLN
75.0000 mL | Freq: Once | INTRAMUSCULAR | Status: AC | PRN
  Administered 2024-03-06: 75 mL via INTRAVENOUS

## 2024-03-06 MED ORDER — ONDANSETRON HCL 4 MG/2ML IJ SOLN
4.0000 mg | Freq: Once | INTRAMUSCULAR | Status: AC
  Administered 2024-03-06: 4 mg via INTRAVENOUS
  Filled 2024-03-06: qty 2

## 2024-03-06 MED ORDER — CLINDAMYCIN HCL 150 MG PO CAPS: 150.0000 mg | ORAL_CAPSULE | Freq: Four times a day (QID) | ORAL | 0 refills | Status: DC

## 2024-03-06 MED ORDER — ACETAMINOPHEN 325 MG PO TABS: 650.0000 mg | ORAL_TABLET | Freq: Four times a day (QID) | ORAL | 0 refills | Status: DC | PRN

## 2024-03-06 MED ORDER — MORPHINE SULFATE (PF) 4 MG/ML IV SOLN
4.0000 mg | Freq: Once | INTRAVENOUS | Status: AC
  Administered 2024-03-06: 4 mg via INTRAVENOUS
  Filled 2024-03-06: qty 1

## 2024-03-06 MED ORDER — OXYCODONE HCL 5 MG PO TABS: 5.0000 mg | ORAL_TABLET | ORAL | 0 refills | Status: DC | PRN

## 2024-03-06 MED ORDER — OXYCODONE HCL 5 MG PO TABS: 5.0000 mg | ORAL_TABLET | ORAL | 0 refills | Status: AC | PRN

## 2024-03-06 MED ORDER — LIDOCAINE VISCOUS HCL 2 % MT SOLN
15.0000 mL | Freq: Once | OROMUCOSAL | Status: AC
  Administered 2024-03-06: 15 mL via OROMUCOSAL
  Filled 2024-03-06: qty 15

## 2024-03-06 MED ORDER — SODIUM CHLORIDE 0.9 % IV BOLUS
1000.0000 mL | Freq: Once | INTRAVENOUS | Status: AC
  Administered 2024-03-06: 1000 mL via INTRAVENOUS

## 2024-03-06 MED ORDER — IBUPROFEN 600 MG PO TABS: 600.0000 mg | ORAL_TABLET | Freq: Four times a day (QID) | ORAL | 0 refills | Status: DC | PRN

## 2024-03-06 NOTE — ED Provider Notes (Addendum)
 [place] [facility] DE[facility]MENT AT [facility] Provider Note  CSN: [id number] Arrival date & time: 03/06/24 1136  Chief Complaint(s) Facial Swelling  HPI Olivia Jarvis is a 33 y.o. female with past medical history as below, significant for asthma, Crohn's disease, DM, GERD, obesity who presents to the ED with complaint of left-sided facial swelling  Notes ongoing over the past 2 days, seems been worsening.  Difficulty opening her mouth, difficulty chewing.  Pain noted with chewing.  No pain with swallowing or difficulty speaking, no difficulty breathing, subjective fever yesterday no vomiting or nausea, no recent dental procedures, no recent head injury.  Past Medical History Past Medical History:  Diagnosis Date   Asthma    Crohn's disease (HCC)    Diabetes mellitus without complication (HCC)    GERD (gastroesophageal reflux disease)    Pregnancy induced hypertension    There are no active problems to display for this patient.  Home Medication(s) Prior to Admission medications  Medication Sig Start Date End Date Taking? Authorizing Provider  acetaminophen  (TYLENOL ) 325 MG tablet Take 2 tablets (650 mg total) by mouth every 6 (six) hours as needed. 03/06/24  Yes Elnor Savant A, DO  clindamycin (CLEOCIN) 150 MG capsule Take 1 capsule (150 mg total) by mouth every 6 (six) hours. 03/06/24  Yes Elnor Savant A, DO  ibuprofen  (ADVIL ) 600 MG tablet Take 1 tablet (600 mg total) by mouth every 6 (six) hours as needed. 03/06/24  Yes Elnor Savant A, DO  oxyCODONE (ROXICODONE) 5 MG immediate release tablet Take 1 tablet (5 mg total) by mouth every 4 (four) hours as needed for severe pain (pain score 7-10). 03/06/24  Yes Elnor Savant A, DO  famotidine  (PEPCID ) 20 MG tablet Take 1 tablet (20 mg total) by mouth 2 (two) times daily. 02/23/17   Arminda Lonell Narrow, MD  ondansetron  (ZOFRAN  ODT) 8 MG disintegrating tablet Take 1 tablet (8 mg total) by mouth every 8 (eight) hours as needed for  nausea or vomiting. 02/23/17   Steinl, Kevin, MD  PREDNISONE PO Take by mouth.    [provider]  promethazine  (PHENERGAN ) 25 MG tablet Take 0.5-1 tablets (12.5-25 mg total) by mouth every 6 (six) hours as needed. 01/31/16   Edmundo Powell BIRCH, CNM                                                                                                                                    Past Surgical History Past Surgical History:  Procedure Laterality Date   CESAREAN SECTION     NO PAST SURGERIES     Family History History reviewed. No pertinent family history.  Social History Social History[1] Allergies Penicillins  Review of Systems A thorough review of systems was obtained and all systems are negative except as noted in the HPI and PMH.   Physical Exam Vital Signs  I have reviewed the triage vital signs BP 115/61 (BP  Location: Left Arm)   Pulse 88   Temp 98.3 F (36.8 C) (Oral)   Resp 18   SpO2 99%  Physical Exam Vitals and nursing note reviewed.  Constitutional:      General: She is not in acute distress.    Appearance: Normal appearance. She is well-developed. She is obese. She is not ill-appearing.  HENT:     Head: Normocephalic and atraumatic. No right periorbital erythema or left periorbital erythema.     Jaw: Trismus and pain on movement present.     Right Ear: External ear normal.     Left Ear: External ear normal.     Nose: Nose normal.     Mouth/Throat:     Mouth: Mucous membranes are moist.     Dentition: Abnormal dentition. Dental tenderness and dental caries present.     Pharynx: Oropharynx is clear. Uvula midline.     Comments: Abnormal dentition noted on the left, no discrete drainable abscess Eyes:     General: No scleral icterus.       Right eye: No discharge.        Left eye: No discharge.  Cardiovascular:     Rate and Rhythm: Normal rate.  Pulmonary:     Effort: Pulmonary effort is normal. No respiratory distress.     Breath sounds: No stridor.   Abdominal:     General: Abdomen is flat. There is no distension.     Tenderness: There is no guarding.  Musculoskeletal:        General: No deformity.     Cervical back: No rigidity.  Skin:    General: Skin is warm and dry.     Coloration: Skin is not cyanotic, jaundiced or pale.  Neurological:     Mental Status: She is alert.  Psychiatric:        Speech: Speech normal.        Behavior: Behavior normal. Behavior is cooperative.     ED Results and Treatments Labs (all labs ordered are listed, but only abnormal results are displayed) Labs Reviewed  BASIC METABOLIC PANEL WITH GFR - Abnormal; Notable for the following components:      Result Value   Glucose, Bld 126 (*)    All other components within normal limits  I-STAT CHEM 8, ED - Abnormal; Notable for the following components:   Creatinine, Ser 1.10 (*)    Glucose, Bld 125 (*)    Calcium, Ion 1.13 (*)    All other components within normal limits  CBC WITH DIFFERENTIAL/PLATELET  HCG, SERUM, QUALITATIVE                                                                                                                          Radiology CT Soft Tissue Neck W Contrast Result Date: 03/06/2024 EXAM: CT NECK WITH CONTRAST 03/06/2024 02:37:02 PM TECHNIQUE: CT of the neck was performed with the administration of intravenous contrast. 75 mL of iohexol (OMNIPAQUE) 300 MG/ML solution was  administered. Multiplanar reformatted images are provided for review. Automated exposure control, iterative reconstruction, and/or weight based adjustment of the mA/kV was utilized to reduce the radiation dose to as low as reasonably achievable. COMPARISON: None available. CLINICAL HISTORY: Left sided facial swelling, trismus. Symptoms for 2 days. FINDINGS: AERODIGESTIVE TRACT: No discrete mass. No edema. SALIVARY GLANDS: The parotid and submandibular glands are unremarkable. THYROID: Unremarkable. LYMPH NODES: No suspicious cervical lymphadenopathy. SOFT  TISSUES: Swelling within the soft tissues anterior to the left maxilla extending into the left aspect of the nose as well as along the buccal aspect of the left maxillary alveolar ridge. No visible fluid collection or abscess. BRAIN, ORBITS, SINUSES AND MASTOIDS: Minimal mucosal thickening in the paranasal sinuses. Clear mastoid air cells and middle ear cavities. LUNGS AND MEDIASTINUM: No acute abnormality. BONES: Numerous dental caries and periapical lucencies, including involving left maxillary and mandibular molars. IMPRESSION: 1. Mild soft tissue swelling anterior and lateral to the left maxilla, nonspecific but could reflect the sequelae of odontogenic infection given extensive dental disease. Electronically signed by: Dasie Hamburg MD 03/06/2024 04:15 PM EST RP Workstation: [id number]    Pertinent labs & imaging results that were available during my care of the patient were reviewed by me and considered in my medical decision making (see MDM for details).  Medications Ordered in ED Medications  sodium chloride  0.9 % bolus 1,000 mL (1,000 mLs Intravenous New Bag/Given 03/06/24 1337)  ondansetron  (ZOFRAN ) injection 4 mg (4 mg Intravenous Given 03/06/24 1337)  morphine (PF) 4 MG/ML injection 4 mg (4 mg Intravenous Given 03/06/24 1336)  lidocaine (XYLOCAINE) 2 % viscous mouth solution 15 mL (15 mLs Mouth/Throat Given 03/06/24 1337)  iohexol (OMNIPAQUE) 300 MG/ML solution 75 mL (75 mLs Intravenous Contrast Given 03/06/24 1428)                                                                                                                                     Procedures Procedures  (including critical care time)  Medical Decision Making / ED Course    Medical Decision Making:    Olivia Jarvis is a 33 y.o. female with past medical history as below, significant for asthma, Crohn's disease, DM, GERD, obesity who presents to the ED with complaint of left-sided facial swelling. The complaint  involves an extensive differential diagnosis and also carries with it a high risk of complications and morbidity.  Serious etiology was considered. Ddx includes but is not limited to: Dental abscess, odontogenic infection, cellulitis, Ludwig angina, sinusitis, etc.  Complete initial physical exam performed, notably the patient was in no acute distress.    Reviewed and confirmed nursing documentation for past medical history, family history, social history.  Vital signs reviewed.    Left-sided facial swelling Dental pain> - Very poor dentition on the left side approximately of the discomfort.  No obvious dental abscess.  She has trismus but no drooling or stridor. - Labs are  stable - CT concern for odontogenic infection - tolerating po, no n/v, no stridor/drooling/trismus - well appearing, non-toxic - She is feeling better, pain well-controlled.  Will start on Augmentin, analgesia for home, encouraged follow-up with dentist     4:41 PM:  I have discussed the diagnosis/risks/treatment options with the patient.  Evaluation and diagnostic testing in the emergency department does not suggest an emergent condition requiring admission or immediate intervention beyond what has been performed at this time.  They will follow up with pcp/dentist. We also discussed returning to the ED immediately if new or worsening sx occur. We discussed the sx which are most concerning (e.g., sudden worsening pain, fever, inability to tolerate by mouth) that necessitate immediate return.    The patient appears reasonably screened and/or stabilized for discharge and I doubt any other medical condition or other [facility] requiring further screening, evaluation, or treatment in the ED at this time prior to discharge.                 Additional history obtained: -Additional history obtained from na -External records from outside source obtained and reviewed including: Chart review including previous notes, labs,  imaging, consultation notes including  PDMP Prior er eval   Lab Tests: -I ordered, reviewed, and interpreted labs.   The pertinent results include:   Labs Reviewed  BASIC METABOLIC PANEL WITH GFR - Abnormal; Notable for the following components:      Result Value   Glucose, Bld 126 (*)    All other components within normal limits  I-STAT CHEM 8, ED - Abnormal; Notable for the following components:   Creatinine, Ser 1.10 (*)    Glucose, Bld 125 (*)    Calcium, Ion 1.13 (*)    All other components within normal limits  CBC WITH DIFFERENTIAL/PLATELET  HCG, SERUM, QUALITATIVE    Notable for labs stable  EKG   EKG Interpretation Date/Time:    Ventricular Rate:    PR Interval:    QRS Duration:    QT Interval:    QTC Calculation:   R Axis:      Text Interpretation:           Imaging Studies ordered: I ordered imaging studies including ct neck I independently visualized the following imaging with scope of interpretation limited to determining acute life threatening conditions related to emergency care; findings noted above I agree with the radiologist interpretation If any imaging was obtained with contrast I closely monitored patient for any possible adverse reaction a/w contrast administration in the emergency department   Medicines ordered and prescription drug management: Meds ordered this encounter  Medications   sodium chloride  0.9 % bolus 1,000 mL   ondansetron  (ZOFRAN ) injection 4 mg   morphine (PF) 4 MG/ML injection 4 mg   lidocaine (XYLOCAINE) 2 % viscous mouth solution 15 mL   iohexol (OMNIPAQUE) 300 MG/ML solution 75 mL   clindamycin (CLEOCIN) 150 MG capsule    Sig: Take 1 capsule (150 mg total) by mouth every 6 (six) hours.    Dispense:  28 capsule    Refill:  0   oxyCODONE (ROXICODONE) 5 MG immediate release tablet    Sig: Take 1 tablet (5 mg total) by mouth every 4 (four) hours as needed for severe pain (pain score 7-10).    Dispense:  10 tablet     Refill:  0   acetaminophen  (TYLENOL ) 325 MG tablet    Sig: Take 2 tablets (650 mg total) by mouth every 6 (  six) hours as needed.    Dispense:  36 tablet    Refill:  0   ibuprofen  (ADVIL ) 600 MG tablet    Sig: Take 1 tablet (600 mg total) by mouth every 6 (six) hours as needed.    Dispense:  30 tablet    Refill:  0    -I have reviewed the patients home medicines and have made adjustments as needed   Consultations Obtained: na   Cardiac Monitoring: Continuous pulse oximetry interpreted by myself, 99% on RA.    Social Determinants of Health:  Diagnosis or treatment significantly limited by social determinants of health: current smoker and obesity Counseled patient for approximately 3 minutes regarding smoking cessation. Discussed risks of smoking and how they applied and affected their visit here today. Patient not ready to quit at this time, however will follow up with their primary doctor when they are.   CPT code: 00593: intermediate counseling for smoking cessation     Reevaluation: After the interventions noted above, I reevaluated the patient and found that they have improved  Co morbidities that complicate the patient evaluation  Past Medical History:  Diagnosis Date   Asthma    Crohn's disease (HCC)    Diabetes mellitus without complication (HCC)    GERD (gastroesophageal reflux disease)    Pregnancy induced hypertension       Dispostion: Disposition decision including need for hospitalization was considered, and patient discharged from emergency department.    Final Clinical Impression(s) / ED Diagnoses Final diagnoses:  Dental infection  Pain, dental        Elnor Jayson LABOR, DO 03/06/24 1641     [1]  Social History Tobacco Use   Smoking status: Every Day   Smokeless tobacco: Never  Substance Use Topics   Alcohol use: No   Drug use: No     Elnor Jayson LABOR, DO 03/06/24 1644

## 2024-03-06 NOTE — Discharge Instructions (Addendum)
 It was a pleasure caring for you today in the emergency department.  Please call dentist to arrange follow up. You can gargle with listerine 3x daily, topical oragel can be helpful. You can also gargle with warm salt water 3x daily. Please take antibiotics as prescribed. I have also sent pain medication to your pharmacy.   Please return to the emergency department for any worsening or worrisome symptoms.     [facility] dental clinic -  [address] [place] - [place] [address] [phone]  [facility] clinic [facility] clinic) treats patients who are in the following groups:  Pediatrics with medicaid up to age 56 Patients over 39 with an orange card and a referral from a medical provider.  - If they are referred - there is a 40$ copay for any service - extration etc.  To get an orange card the following process is necessary.  Call the [facility] to apply for card - [phone] Once approved, you the patient will be set up with a medical provider Medical provider will refer patient to the [facility] clinic .  Offices accepting Medicaid patients:  Urgent tooth - [address] [place], [place] [address] [phone]  [facility] - [address], [place] [address] [phone]  [facility] (will see emergency dental patients from ED) - [address] [place], [place] [address] [phone]    RESOURCE GUIDE  Chronic Pain Problems: Contact [facility]  [phone] Patients need to be referred by their primary care doctor.  Insufficient Money for Medicine: Contact [employer]:  call [phone]  No Primary Care Doctor: Call [facility]  [phone] - can help you locate a primary care doctor that  accepts your insurance, provides certain services, etc. Physician Referral Service- [phone]  Agencies that provide inexpensive medical care: [facility]  [phone] [facility]   [phone] [facility]  [phone] [facility]  [phone] [facility]  [phone] [facility]  [phone]  Medicaid-accepting [facility] Providers: [facility]- [address]  [phone], Mon-Fri 9am-7pm, Sat 9am-1pm [facility]- [address] [place], [address] [place]  [phone] [facility]- [address] [place]  [phone] [facility]- [address]  [phone] [facility]- [address] [place], [address], [phone]  Only accepts [place] patients after they have their name  applied to their card  Self Pay (no insurance) in [facility]: Sickle Cell Patients - [facility]  [address] [place], [phone] [facility]- [address] [place]  [phone]       [place] [facility] [place]- [address]       -     [facility]- see information above (Speak to [employer] if you do not have insurance)       -  [facility]- [address],  [phone]       -  [facility]- [address], [phone]       -  [facility]-  [address], [place], [phone]       -  [facility] - [address] [phone]       -  [facility]- [address], [phone], also [address]  [address], [phone]       -     [facility] [address] [place], [phone], 1st & 3rd Saturday         every month, 10am-1pm  -     [facility] [employer]   [address] [place], [place].   Phone:  [phone], Fax:  [phone]. Hours of Operation:  9 am - 6 pm, M-F.  -     [facility]   [address], [place]   Phone: [phone], Fax: [phone]. Hours of Operation:  8:30 am - 5:30 pm, M-F.    Dental Assistance If unable to pay or uninsured, contact:  [facility]. to become  qualified for the adult dental clinic.  Patients with Medicaid: [facility] [phone], [phone] [address], [phone]  If unable to pay, or uninsured, contact [facility] [phone] in [place] [place], [phone] in [facility]) to become qualified for the adult dental clinic  [facility] [address] [place], [place] [address] [phone] [email or web address]  Other Proofreader Services: [facility]- [address] [place], [place], [place], [address], [phone], Ext. [phone], 2nd and 4th Thursday of the month at 6:30am.  10 clients each day by appointment, can sometimes see walk-in patients if someone does not show for an appointment. [facility]- [address] [place], [place], [address], [phone] [facility] [address] [place], [place], [address], [phone] [facility] Health Department- [phone] [facility] Health Department- [phone] [facility] [phone]

## 2024-03-06 NOTE — ED Triage Notes (Signed)
 Pt presents with facial swelling that started two days ago, Pt reports she woke up and it was swollen. Pt states I can fell a heartbeat in my face.  Pt denies any facial trauma.
